# Patient Record
Sex: Female | Born: 1995 | Race: White | Hispanic: No | Marital: Single | Smoking: Former smoker
Health system: Southern US, Community
[De-identification: ages and names within clinical notes are randomized; demographics above are authoritative.]

## PROBLEM LIST (undated history)

## (undated) DIAGNOSIS — I10 Essential (primary) hypertension: Secondary | ICD-10-CM

## (undated) DIAGNOSIS — N809 Endometriosis, unspecified: Secondary | ICD-10-CM

## (undated) HISTORY — DX: Essential (primary) hypertension: I10

## (undated) HISTORY — PX: DILATION AND CURETTAGE OF UTERUS: SHX78

## (undated) HISTORY — PX: ADENOIDECTOMY: SUR15

---
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
## 2017-08-12 ENCOUNTER — Other Ambulatory Visit: Payer: Self-pay

## 2017-08-12 ENCOUNTER — Emergency Department
Admission: EM | Admit: 2017-08-12 | Discharge: 2017-08-12 | Disposition: A | Discharge status: Home / Self Care | Payer: Self-pay | Attending: Emergency Medicine | Admitting: Emergency Medicine

## 2017-08-12 DIAGNOSIS — R1030 Lower abdominal pain, unspecified: Secondary | ICD-10-CM

## 2017-08-12 DIAGNOSIS — F17200 Nicotine dependence, unspecified, uncomplicated: Secondary | ICD-10-CM | POA: Insufficient documentation

## 2017-08-12 DIAGNOSIS — N921 Excessive and frequent menstruation with irregular cycle: Secondary | ICD-10-CM | POA: Insufficient documentation

## 2017-08-12 HISTORY — DX: Endometriosis, unspecified: N80.9

## 2017-08-12 LAB — URINALYSIS, COMPLETE (UACMP) WITH MICROSCOPIC
BACTERIA UA: NONE SEEN
BILIRUBIN URINE: NEGATIVE
Glucose, UA: NEGATIVE mg/dL
Hgb urine dipstick: NEGATIVE
Ketones, ur: NEGATIVE mg/dL
LEUKOCYTES UA: NEGATIVE
Nitrite: NEGATIVE
Protein, ur: NEGATIVE mg/dL
Specific Gravity, Urine: 1.027 (ref 1.005–1.030)
pH: 6 (ref 5.0–8.0)

## 2017-08-12 LAB — POCT PREGNANCY, URINE: PREG TEST UR: NEGATIVE

## 2017-08-12 MED ORDER — NORGESTIMATE-ETH ESTRADIOL 0.25-35 MG-MCG PO TABS: 1.0000 | ORAL_TABLET | Freq: Every day | ORAL | 2 refills | Status: DC

## 2017-08-12 MED ORDER — KETOROLAC TROMETHAMINE 60 MG/2ML IM SOLN
15.0000 mg | Freq: Once | INTRAMUSCULAR | Status: AC
  Administered 2017-08-12: 15 mg via INTRAMUSCULAR
  Filled 2017-08-12: qty 2

## 2017-08-12 MED ORDER — ACETAMINOPHEN 500 MG PO TABS
1000.0000 mg | ORAL_TABLET | Freq: Once | ORAL | Status: AC
  Administered 2017-08-12: 1000 mg via ORAL
  Filled 2017-08-12: qty 2

## 2017-08-12 NOTE — ED Triage Notes (Signed)
Pt c/o lower abd pain for the past 2 months. States she has been dx with endometriosis  And has been having heavy periods. Denies N/V/D/fever..Marland Kitchen

## 2017-08-12 NOTE — ED Notes (Signed)
Dr. Stafford at bedside with pt.  

## 2017-08-12 NOTE — ED Notes (Signed)
Pt ambulatory to POC. VSS. NAD. Pain is better. Discharge instructions, RX and follow up discussed with pt. All questions answered.

## 2017-08-12 NOTE — Discharge Instructions (Signed)
Your urine test and pregnancy tests today were negative. Start taking birth control pills to help manage your symptoms. Follow up with gynecology for further management of these symptoms.

## 2017-08-12 NOTE — ED Provider Notes (Addendum)
[facility] Emergency Department Provider Note  ____________________________________________  Time seen: Approximately 10:43 AM  I have reviewed the triage vital signs and the nursing notes.   HISTORY  Chief Complaint Abdominal Pain    HPI Sylvia Parks is a 22 y.o. female who complains of lower abdominal pain for the past 2 months off and on, along with irregular bleeding approximately 15 or 16 days out of every month. Reports chronic problems with irregular bleeding for the past 4 years. Had been told in the past that she has endometriosis although she has not had a diagnostic laparoscopy. This had been improved by taking hormonal contraceptives in the past, but she has recently moved to [place] and has not followed up with any doctors recently. She is off hormonal contraceptives at this time. She is sexually active. She does report frequent UTIs as well. Complains of suprapubic pain that is nonradiating, no aggravating or alleviating factors, moderate intensity and aching. Feels that her abdominal pain and vaginal bleeding interferes with her ability to work and lifestyle.     Past Medical History:  Diagnosis Date  . Endometriosis      There are no active problems to display for this patient.    Past Surgical History:  Procedure Laterality Date  . ADENOIDECTOMY       Prior to Admission medications   Medication Sig Start Date End Date Taking? Authorizing Provider  amoxicillin (AMOXIL) 500 MG tablet Take 1 tablet (500 mg total) by mouth 3 (three) times daily with meals. Patient not taking: Reported on 08/12/2017 03/30/16   Hagler, Jami L, PA-C  lidocaine (XYLOCAINE) 2 % solution Use as directed 10 mLs in the mouth or throat every 4 (four) hours as needed for mouth pain. Patient not taking: Reported on 08/12/2017 03/30/16   Hagler, Jami L, PA-C  norgestimate-ethinyl estradiol (SPRINTEC 28) 0.25-35 MG-MCG tablet Take 1 tablet by mouth daily. 08/12/17    Sharman CheekStafford, Cianna Kasparian, MD  predniSONE (DELTASONE) 5 MG tablet Take 6 tablets (30 mg total) by mouth daily with breakfast. May take for up to 5 days.  Do not take any NSAIDs with this medication. Take with food. Patient not taking: Reported on 08/12/2017 03/30/16   Hagler, Jami L, PA-C     Allergies Patient has no known allergies.   No family history on file.  Social History Social History   Tobacco Use  . Smoking status: Current Some Day Smoker  . Smokeless tobacco: Never Used  Substance Use Topics  . Alcohol use: No  . Drug use: Not on file    Review of Systems  Constitutional:   No fever or chills.  ENT:   No sore throat. No rhinorrhea. Cardiovascular:   No chest pain or syncope. Respiratory:   No dyspnea or cough. Gastrointestinal:   Lower abdominal pain as above without vomiting and diarrhea.  Musculoskeletal:   Negative for focal pain or swelling All other systems reviewed and are negative except as documented above in ROS and HPI.  ____________________________________________   PHYSICAL EXAM:  VITAL SIGNS: ED Triage Vitals  Enc Vitals Group     BP 08/12/17 0818 (!) 166/97     Pulse Rate 08/12/17 0818 64     Resp 08/12/17 0818 16     Temp 08/12/17 0818 97.6 F (36.4 C)     Temp Source 08/12/17 0818 Oral     SpO2 08/12/17 0818 100 %     Weight 08/12/17 0819 140 lb (63.5 kg)  Height 08/12/17 0819 5\' 4"  (1.626 m)     Head Circumference --      Peak Flow --      Pain Score 08/12/17 0819 7     Pain Loc --      Pain Edu? --      Excl. in GC? --     Vital signs reviewed, nursing assessments reviewed.   Constitutional:   Alert and oriented. Well appearing and in no distress. Eyes:   No scleral icterus.  EOMI. No nystagmus. No conjunctival pallor. PERRL. ENT   Head:   Normocephalic and atraumatic.   Nose:   No congestion/rhinnorhea.    Mouth/Throat:   MMM, no pharyngeal erythema. No peritonsillar mass.    Neck:   No meningismus. Full  ROM. Hematological/Lymphatic/Immunilogical:   No cervical lymphadenopathy. Cardiovascular:   RRR. Symmetric bilateral radial and DP pulses.  No murmurs.  Respiratory:   Normal respiratory effort without tachypnea/retractions. Breath sounds are clear and equal bilaterally. No wheezes/rales/rhonchi. Gastrointestinal:   Soft with suprapubic tenderness. Non distended. There is no CVA tenderness.  No rebound, rigidity, or guarding. Genitourinary:   deferred Musculoskeletal:   Normal range of motion in all extremities. No joint effusions.  No lower extremity tenderness.  No edema. Neurologic:   Normal speech and language.  Motor grossly intact. No acute focal neurologic deficits are appreciated.  Skin:    Skin is warm, dry and intact. No rash noted.  No petechiae, purpura, or bullae.  ____________________________________________    LABS (pertinent positives/negatives) (all labs ordered are listed, but only abnormal results are displayed) Labs Reviewed  URINALYSIS, COMPLETE (UACMP) WITH MICROSCOPIC - Abnormal; Notable for the following components:      Result Value   Color, Urine YELLOW (*)    APPearance CLOUDY (*)    Squamous Epithelial / LPF 6-30 (*)    All other components within normal limits  POC URINE PREG, ED  POCT PREGNANCY, URINE   ____________________________________________   EKG    ____________________________________________    RADIOLOGY  No results found.  ____________________________________________   PROCEDURES Procedures  ____________________________________________    CLINICAL IMPRESSION / ASSESSMENT AND PLAN / ED COURSE  Pertinent labs & imaging results that were available during my care of the patient were reviewed by me and considered in my medical decision making (see chart for details).   Patient well-appearing no acute distress, unremarkable vital signs, appears to not be in severe pain, presents with chronic recurrent abdominal pain and  vaginal bleeding, metrorrhagia. Low suspicion of STI PID TOA or torsion. Low suspicion of ovarian cyst or appendicitis. This is all consistent with her previous chronic issues, likely worse now because she is unfortunately not been able to follow up with anybody or continue medications. Urinalysis is negative, pregnancy test is negative. I will prescribe her Sprintec OCPs, refer to gynecology for further management.      ____________________________________________   FINAL CLINICAL IMPRESSION(S) / ED DIAGNOSES    Final diagnoses:  Metrorrhagia  Lower abdominal pain     ED Discharge Orders        Ordered    norgestimate-ethinyl estradiol (SPRINTEC 28) 0.25-35 MG-MCG tablet  Daily     08/12/17 1042      Portions of this note were generated with dragon dictation software. Dictation errors may occur despite best attempts at proofreading.    Sharman Cheek, MD 08/12/17 1045    Sharman Cheek, MD 08/12/17 1046

## 2017-09-17 ENCOUNTER — Emergency Department
Admission: EM | Admit: 2017-09-17 | Discharge: 2017-09-17 | Disposition: A | Discharge status: Home / Self Care | Payer: Self-pay | Attending: Emergency Medicine | Admitting: Emergency Medicine

## 2017-09-17 ENCOUNTER — Emergency Department: Payer: Self-pay

## 2017-09-17 ENCOUNTER — Encounter: Payer: Self-pay | Admitting: Emergency Medicine

## 2017-09-17 DIAGNOSIS — Z79899 Other long term (current) drug therapy: Secondary | ICD-10-CM | POA: Insufficient documentation

## 2017-09-17 DIAGNOSIS — R102 Pelvic and perineal pain: Secondary | ICD-10-CM | POA: Insufficient documentation

## 2017-09-17 DIAGNOSIS — R1030 Lower abdominal pain, unspecified: Secondary | ICD-10-CM

## 2017-09-17 DIAGNOSIS — Z87891 Personal history of nicotine dependence: Secondary | ICD-10-CM | POA: Insufficient documentation

## 2017-09-17 LAB — CBC WITH DIFFERENTIAL/PLATELET
Basophils Absolute: 0.1 10*3/uL (ref 0–0.1)
Basophils Relative: 1 %
EOS PCT: 3 %
Eosinophils Absolute: 0.3 10*3/uL (ref 0–0.7)
HCT: 44.3 % (ref 35.0–47.0)
Hemoglobin: 14.6 g/dL (ref 12.0–16.0)
LYMPHS ABS: 2.2 10*3/uL (ref 1.0–3.6)
LYMPHS PCT: 25 %
MCH: 27.8 pg (ref 26.0–34.0)
MCHC: 33.1 g/dL (ref 32.0–36.0)
MCV: 84.1 fL (ref 80.0–100.0)
MONO ABS: 0.6 10*3/uL (ref 0.2–0.9)
MONOS PCT: 7 %
Neutro Abs: 5.5 10*3/uL (ref 1.4–6.5)
Neutrophils Relative %: 64 %
PLATELETS: 310 10*3/uL (ref 150–440)
RBC: 5.26 MIL/uL — ABNORMAL HIGH (ref 3.80–5.20)
RDW: 14.3 % (ref 11.5–14.5)
WBC: 8.7 10*3/uL (ref 3.6–11.0)

## 2017-09-17 LAB — CHLAMYDIA/NGC RT PCR (ARMC ONLY)
CHLAMYDIA TR: NOT DETECTED
N gonorrhoeae: NOT DETECTED

## 2017-09-17 LAB — WET PREP, GENITAL
Sperm: NONE SEEN
TRICH WET PREP: NONE SEEN
Yeast Wet Prep HPF POC: NONE SEEN

## 2017-09-17 LAB — URINALYSIS, ROUTINE W REFLEX MICROSCOPIC
BACTERIA UA: NONE SEEN
Bilirubin Urine: NEGATIVE
GLUCOSE, UA: NEGATIVE mg/dL
Hgb urine dipstick: NEGATIVE
Ketones, ur: NEGATIVE mg/dL
Leukocytes, UA: NEGATIVE
Nitrite: NEGATIVE
PH: 8 (ref 5.0–8.0)
Protein, ur: NEGATIVE mg/dL
Specific Gravity, Urine: 1.016 (ref 1.005–1.030)

## 2017-09-17 LAB — COMPREHENSIVE METABOLIC PANEL
ALBUMIN: 4.7 g/dL (ref 3.5–5.0)
ALT: 24 U/L (ref 14–54)
AST: 28 U/L (ref 15–41)
Alkaline Phosphatase: 76 U/L (ref 38–126)
Anion gap: 6 (ref 5–15)
BUN: 11 mg/dL (ref 6–20)
CO2: 26 mmol/L (ref 22–32)
Calcium: 9.4 mg/dL (ref 8.9–10.3)
Chloride: 106 mmol/L (ref 101–111)
Creatinine, Ser: 0.66 mg/dL (ref 0.44–1.00)
GFR calc Af Amer: >60 mL/min (ref >60)
GFR calc non Af Amer: >60 mL/min (ref >60)
GLUCOSE: 111 mg/dL — AB (ref 65–99)
POTASSIUM: 4.1 mmol/L (ref 3.5–5.1)
SODIUM: 138 mmol/L (ref 135–145)
TOTAL PROTEIN: 8 g/dL (ref 6.5–8.1)
Total Bilirubin: 0.4 mg/dL (ref 0.3–1.2)

## 2017-09-17 LAB — PREGNANCY, URINE: Preg Test, Ur: NEGATIVE

## 2017-09-17 MED ORDER — TRAMADOL HCL 50 MG PO TABS: 50.0000 mg | ORAL_TABLET | Freq: Four times a day (QID) | ORAL | 0 refills | Status: DC | PRN

## 2017-09-17 NOTE — ED Notes (Signed)
Urine POC negative. 

## 2017-09-17 NOTE — ED Notes (Signed)
Pt discharged to home.  Family member driving.  Discharge instructions reviewed.  Verbalized understanding.  No questions or concerns at this time.  Teach back verified.  Pt in NAD.  No items left in ED.   

## 2017-09-17 NOTE — ED Triage Notes (Signed)
Patient presents to the ED with heavy vaginal bleeding and abdominal pain since 2am.  Patient states she is having to change her tampon every hour.  Patient reports being seen in the ED approx. 2 months ago and diagnosed with endometriosis.  Patient was started on birth control at that time.  Patient reports having a "period" from March 24-April 4th and then began spotting and then bleeding yesterday/today.

## 2017-09-17 NOTE — ED Notes (Signed)
Patient transported to Ultrasound 

## 2017-09-17 NOTE — ED Provider Notes (Signed)
[facility] Emergency Department Provider Note  Time seen: 9:11 AM  I have reviewed the triage vital signs and the nursing notes.   HISTORY  Chief Complaint Pelvic Pain    HPI Sylvia Parks is a 22 y.o. female with a past medical history of endometriosis confirmed on laparoscopy 4 years ago per patient presents to the emergency department for vaginal bleeding and abdominal discomfort.  According to the patient she had her period March 24 - April 4.  States the bleeding is stopped until last night when she began having heavy bleeding again requiring her to change her pad every hour or so.  States that stopped around 4 5:00 this morning now she is having very light vaginal spotting.  Patient states she is having 4/10 lower abdominal pain which she states is very consistent with her typical endometriosis pain.  States she does not typically have bleeding between periods which is abnormal which is what concerned her and brought her to the emergency department today.  Patient denies any vaginal discharge.  Largely negative review of systems otherwise.   Past Medical History:  Diagnosis Date  . Endometriosis     There are no active problems to display for this patient.   Past Surgical History:  Procedure Laterality Date  . ADENOIDECTOMY      Prior to Admission medications   Medication Sig Start Date End Date Taking? Authorizing Provider  amoxicillin (AMOXIL) 500 MG tablet Take 1 tablet (500 mg total) by mouth 3 (three) times daily with meals. Patient not taking: Reported on 08/12/2017 03/30/16   Hagler, Jami L, PA-C  lidocaine (XYLOCAINE) 2 % solution Use as directed 10 mLs in the mouth or throat every 4 (four) hours as needed for mouth pain. Patient not taking: Reported on 08/12/2017 03/30/16   Hagler, Jami L, PA-C  norgestimate-ethinyl estradiol (SPRINTEC 28) 0.25-35 MG-MCG tablet Take 1 tablet by mouth daily. 08/12/17   Sharman Cheek, MD  predniSONE (DELTASONE) 5  MG tablet Take 6 tablets (30 mg total) by mouth daily with breakfast. May take for up to 5 days.  Do not take any NSAIDs with this medication. Take with food. Patient not taking: Reported on 08/12/2017 03/30/16   Hagler, Jami L, PA-C    No Known Allergies  No family history on file.  Social History Social History   Tobacco Use  . Smoking status: Former Games developer  . Smokeless tobacco: Never Used  Substance Use Topics  . Alcohol use: No  . Drug use: Not on file    Review of Systems Constitutional: Negative for fever. Eyes: Negative for visual complaints ENT: Negative for recent illness Cardiovascular: Negative for chest pain. Respiratory: Negative for shortness of breath. Gastrointestinal: 4/10 dull aching lower abdominal pain.  Negative for nausea vomiting or diarrhea Genitourinary: Negative for urinary compaints.  Heavy vaginal bleeding overnight, currently with light vaginal spotting per patient. Musculoskeletal: Negative for musculoskeletal complaints Skin: Negative for skin complaints  Neurological: Negative for headache All other ROS negative  ____________________________________________   PHYSICAL EXAM:  VITAL SIGNS: ED Triage Vitals  Enc Vitals Group     BP 09/17/17 0833 (!) 156/95     Pulse Rate 09/17/17 0833 81     Resp 09/17/17 0833 18     Temp 09/17/17 0833 97.9 F (36.6 C)     Temp Source 09/17/17 0833 Oral     SpO2 09/17/17 0833 98 %     Weight 09/17/17 0834 140 lb (63.5 kg)  Height 09/17/17 0834 5\' 4"  (1.626 m)     Head Circumference --      Peak Flow --      Pain Score 09/17/17 0833 7     Pain Loc --      Pain Edu? --      Excl. in GC? --     Constitutional: Alert and oriented. Well appearing and in no distress. Eyes: Normal exam ENT   Head: Normocephalic and atraumatic.   Mouth/Throat: Mucous membranes are moist. Cardiovascular: Normal rate, regular rhythm. No murmur Respiratory: Normal respiratory effort without tachypnea nor  retractions. Breath sounds are clear  Gastrointestinal: Soft, mild to moderate suprapubic tenderness to palpation.  No rebound or guarding.  No distention. Musculoskeletal: Nontender with normal range of motion in all extremities.  Neurologic:  Normal speech and language. No gross focal neurologic deficits  Skin:  Skin is warm, dry and intact.  Psychiatric: Mood and affect are normal.   ____________________________________________   RADIOLOGY  Ultrasound negative  ____________________________________________   INITIAL IMPRESSION / ASSESSMENT AND PLAN / ED COURSE  Pertinent labs & imaging results that were available during my care of the patient were reviewed by me and considered in my medical decision making (see chart for details).  Patient presents to the emergency department for lower abdominal discomfort and vaginal bleeding.  Differential would include endometriosis, dysfunctional uterine bleeding, miscarriage.  We will check labs including urine pregnancy test.  Will perform a pelvic examination and likely proceed with ultrasound.  Patient agreeable to this plan of care.  Patient has mild to moderate comfort throughout pelvic exam.  No bleeding.  Normal amount of discharge.  We will sent for ultrasound to further evaluate.  Patient agreeable to plan.  Ultrasound negative.  Wet prep and GC/chlamydia negative.  We will discharge the patient home with PCP follow-up.  Highly suspect endometriosis we will discharge with a short course of pain medication.  ____________________________________________   FINAL CLINICAL IMPRESSION(S) / ED DIAGNOSES  Lower abdominal pain Vaginal bleeding    Minna AntisPaduchowski, Karman Veney, MD 09/17/17 1234

## 2017-11-24 ENCOUNTER — Emergency Department
Admission: EM | Admit: 2017-11-24 | Discharge: 2017-11-24 | Disposition: A | Discharge status: Home / Self Care | Payer: Medicaid Other | Attending: Emergency Medicine | Admitting: Emergency Medicine

## 2017-11-24 ENCOUNTER — Other Ambulatory Visit: Payer: Self-pay

## 2017-11-24 ENCOUNTER — Encounter: Payer: Self-pay | Admitting: Emergency Medicine

## 2017-11-24 DIAGNOSIS — Z87891 Personal history of nicotine dependence: Secondary | ICD-10-CM | POA: Insufficient documentation

## 2017-11-24 DIAGNOSIS — Z79899 Other long term (current) drug therapy: Secondary | ICD-10-CM | POA: Insufficient documentation

## 2017-11-24 DIAGNOSIS — Y9389 Activity, other specified: Secondary | ICD-10-CM | POA: Insufficient documentation

## 2017-11-24 DIAGNOSIS — Y990 Civilian activity done for income or pay: Secondary | ICD-10-CM | POA: Insufficient documentation

## 2017-11-24 DIAGNOSIS — X100XXA Contact with hot drinks, initial encounter: Secondary | ICD-10-CM | POA: Insufficient documentation

## 2017-11-24 DIAGNOSIS — T22111A Burn of first degree of right forearm, initial encounter: Secondary | ICD-10-CM | POA: Insufficient documentation

## 2017-11-24 DIAGNOSIS — T23102A Burn of first degree of left hand, unspecified site, initial encounter: Secondary | ICD-10-CM | POA: Insufficient documentation

## 2017-11-24 DIAGNOSIS — T300 Burn of unspecified body region, unspecified degree: Secondary | ICD-10-CM

## 2017-11-24 DIAGNOSIS — Y92512 Supermarket, store or market as the place of occurrence of the external cause: Secondary | ICD-10-CM | POA: Insufficient documentation

## 2017-11-24 MED ORDER — MORPHINE SULFATE (PF) 4 MG/ML IV SOLN
4.0000 mg | Freq: Once | INTRAVENOUS | Status: AC
Start: 2017-11-24 — End: 2017-11-24
  Administered 2017-11-24: 4 mg via INTRAMUSCULAR
  Filled 2017-11-24: qty 1

## 2017-11-24 MED ORDER — OXYCODONE HCL 5 MG PO TABS: 5.0000 mg | ORAL_TABLET | Freq: Four times a day (QID) | ORAL | 0 refills | Status: AC | PRN

## 2017-11-24 MED ORDER — SILVER SULFADIAZINE 1 % EX CREA
TOPICAL_CREAM | Freq: Once | CUTANEOUS | Status: AC
  Administered 2017-11-24: 21:00:00 via TOPICAL
  Filled 2017-11-24: qty 85
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
## 2017-11-24 NOTE — Discharge Instructions (Addendum)
Please seek medical attention for any high fevers, chest pain, shortness of breath, change in behavior, persistent vomiting, bloody stool or any other new or concerning symptoms.  
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
## 2020-12-04 ENCOUNTER — Emergency Department
Admission: EM | Admit: 2020-12-04 | Discharge: 2020-12-04 | Disposition: A | Discharge status: Home / Self Care | Payer: Medicaid Other | Attending: Emergency Medicine | Admitting: Emergency Medicine

## 2020-12-04 ENCOUNTER — Other Ambulatory Visit: Payer: Self-pay

## 2020-12-04 DIAGNOSIS — N3001 Acute cystitis with hematuria: Secondary | ICD-10-CM | POA: Diagnosis not present

## 2020-12-04 DIAGNOSIS — Z87891 Personal history of nicotine dependence: Secondary | ICD-10-CM | POA: Insufficient documentation

## 2020-12-04 DIAGNOSIS — B9689 Other specified bacterial agents as the cause of diseases classified elsewhere: Secondary | ICD-10-CM | POA: Insufficient documentation

## 2020-12-04 DIAGNOSIS — R300 Dysuria: Secondary | ICD-10-CM | POA: Diagnosis present

## 2020-12-04 LAB — URINALYSIS, COMPLETE (UACMP) WITH MICROSCOPIC
Bilirubin Urine: NEGATIVE
Glucose, UA: NEGATIVE mg/dL
Ketones, ur: 20 mg/dL — AB
Nitrite: NEGATIVE
Protein, ur: 100 mg/dL — AB
Specific Gravity, Urine: 1.029 (ref 1.005–1.030)
WBC, UA: >50 WBC/hpf — ABNORMAL HIGH (ref 0–5)
pH: 5 (ref 5.0–8.0)

## 2020-12-04 LAB — POC URINE PREG, ED: Preg Test, Ur: NEGATIVE

## 2020-12-04 MED ORDER — CEPHALEXIN 500 MG PO CAPS: 500.0000 mg | ORAL_CAPSULE | Freq: Two times a day (BID) | ORAL | 0 refills | Status: AC

## 2020-12-04 NOTE — ED Triage Notes (Signed)
Pt to ED for discomfort when urinating that started yesterday, states discomfort also when not urinating.  Pt reports recently found out husband was cheating and concerned about STD's as well

## 2020-12-04 NOTE — ED Provider Notes (Signed)
[facility] Emergency Department Provider Note  ____________________________________________  Time seen: Approximately 3:56 PM  I have reviewed the triage vital signs and the nursing notes.   HISTORY  Chief Complaint Dysuria    HPI Sylvia Parks is a 25 y.o. female who presents to the emergency department for treatment and evaluation of dysuria and concern for STD. She denies fever, nausea, or vomiting.   Past Medical History:  Diagnosis Date   Endometriosis     There are no problems to display for this patient.   Past Surgical History:  Procedure Laterality Date   ADENOIDECTOMY      Prior to Admission medications   Medication Sig Start Date End Date Taking? Authorizing Provider  cephALEXin (KEFLEX) 500 MG capsule Take 1 capsule (500 mg total) by mouth 2 (two) times daily for 7 days. 12/04/20 12/11/20 Yes Tasheema Perrone B, FNP  acetaminophen (TYLENOL) 500 MG tablet Take 500 mg by mouth every 6 (six) hours as needed.    [provider]  lidocaine (XYLOCAINE) 2 % solution Use as directed 10 mLs in the mouth or throat every 4 (four) hours as needed for mouth pain. Patient not taking: Reported on 08/12/2017 03/30/16   Hagler, Jami L, PA-C  norgestimate-ethinyl estradiol (SPRINTEC 28) 0.25-35 MG-MCG tablet Take 1 tablet by mouth daily. 08/12/17   Sharman Cheek, MD  predniSONE (DELTASONE) 5 MG tablet Take 6 tablets (30 mg total) by mouth daily with breakfast. May take for up to 5 days.  Do not take any NSAIDs with this medication. Take with food. Patient not taking: Reported on 08/12/2017 03/30/16   Hagler, Jami L, PA-C  traMADol (ULTRAM) 50 MG tablet Take 1 tablet (50 mg total) by mouth every 6 (six) hours as needed. 09/17/17   Minna Antis, MD    Allergies Patient has no known allergies.  No family history on file.  Social History Social History   Tobacco Use   Smoking status: Former    Pack years: 0.00   Smokeless tobacco: Never   Substance Use Topics   Alcohol use: No    Review of Systems Constitutional: Negative for fever. Respiratory: Negative for shortness of breath or cough. Gastrointestinal: Negative for abdominal pain; negative for nausea , negative for vomiting. Genitourinary: Positive for dysuria , negative for vaginal discharge. Musculoskeletal: Negative for back pain. Skin: Negative for acute skin changes/rash/lesion. ____________________________________________   PHYSICAL EXAM:  VITAL SIGNS: ED Triage Vitals  Enc Vitals Group     BP 12/04/20 1443 133/87     Pulse Rate 12/04/20 1443 (!) 104     Resp 12/04/20 1443 16     Temp 12/04/20 1443 97.7 F (36.5 C)     Temp Source 12/04/20 1443 Oral     SpO2 12/04/20 1443 100 %     Weight 12/04/20 1444 150 lb (68 kg)     Height 12/04/20 1444 5\' 4"  (1.626 m)     Head Circumference --      Peak Flow --      Pain Score 12/04/20 1444 6     Pain Loc --      Pain Edu? --      Excl. in GC? --     Constitutional: Alert and oriented. Well appearing and in no acute distress. Eyes: Conjunctivae are normal. Head: Atraumatic. Nose: No congestion/rhinnorhea. Mouth/Throat: Mucous membranes are moist. Respiratory: Normal respiratory effort.  No retractions. Gastrointestinal: Bowel sounds active x 4; Abdomen is soft without rebound or guarding. Genitourinary: Pelvic  exam: Cervix closed. No discharge noted Musculoskeletal: No extremity tenderness nor edema.  Neurologic:  Normal speech and language. No gross focal neurologic deficits are appreciated. Speech is normal. No gait instability. Skin:  Skin is warm, dry and intact. No rash noted on exposed skin. Psychiatric: Mood and affect are normal. Speech and behavior are normal.  ____________________________________________   LABS (all labs ordered are listed, but only abnormal results are displayed)  Labs Reviewed  URINALYSIS, COMPLETE (UACMP) WITH MICROSCOPIC - Abnormal; Notable for the following  components:      Result Value   Color, Urine YELLOW (*)    APPearance CLOUDY (*)    Hgb urine dipstick LARGE (*)    Ketones, ur 20 (*)    Protein, ur 100 (*)    Leukocytes,Ua LARGE (*)    WBC, UA >50 (*)    Bacteria, UA RARE (*)    All other components within normal limits  CHLAMYDIA/NGC RT PCR ([facility] ONLY)            POC URINE PREG, ED   ____________________________________________  RADIOLOGY  Not indicated. ____________________________________________  Procedures  ____________________________________________  25 year old female presents to the ER for dysuria and STD concerns. See HPI.  Specimens collected and sent to lab although pelvic exam was reassuring. No STD treatment prior to discharge.  Urinalysis concerning for acute cystitis. Will treat with antibiotics and have her follow up with primary care in 10-14 days.  INITIAL IMPRESSION / ASSESSMENT AND PLAN / ED COURSE  Pertinent labs & imaging results that were available during my care of the patient were reviewed by me and considered in my medical decision making (see chart for details).  ____________________________________________   FINAL CLINICAL IMPRESSION(S) / ED DIAGNOSES  Final diagnoses:  Acute cystitis with hematuria    Note:  This document was prepared using Dragon voice recognition software and may include unintentional dictation errors.   Chinita Pester, FNP 12/05/20 0010    Sharman Cheek, MD 12/05/20 [phone]

## 2020-12-30 ENCOUNTER — Other Ambulatory Visit: Payer: Self-pay

## 2020-12-30 ENCOUNTER — Ambulatory Visit
Admission: EM | Admit: 2020-12-30 | Discharge: 2020-12-30 | Disposition: A | Discharge status: Home / Self Care | Payer: Medicaid Other | Attending: Family Medicine | Admitting: Family Medicine

## 2020-12-30 DIAGNOSIS — B9689 Other specified bacterial agents as the cause of diseases classified elsewhere: Secondary | ICD-10-CM | POA: Diagnosis not present

## 2020-12-30 DIAGNOSIS — N760 Acute vaginitis: Secondary | ICD-10-CM | POA: Diagnosis not present

## 2020-12-30 LAB — URINALYSIS, COMPLETE (UACMP) WITH MICROSCOPIC
Bilirubin Urine: NEGATIVE
Glucose, UA: NEGATIVE mg/dL
Ketones, ur: NEGATIVE mg/dL
Leukocytes,Ua: NEGATIVE
Nitrite: NEGATIVE
Protein, ur: NEGATIVE mg/dL
Specific Gravity, Urine: 1.025 (ref 1.005–1.030)
pH: 5 (ref 5.0–8.0)

## 2020-12-30 LAB — CHLAMYDIA/NGC RT PCR (ARMC ONLY)
Chlamydia Tr: NOT DETECTED
N gonorrhoeae: NOT DETECTED

## 2020-12-30 LAB — WET PREP, GENITAL
Sperm: NONE SEEN
Trich, Wet Prep: NONE SEEN
Yeast Wet Prep HPF POC: NONE SEEN

## 2020-12-30 MED ORDER — METRONIDAZOLE 500 MG PO TABS: 500.0000 mg | ORAL_TABLET | Freq: Two times a day (BID) | ORAL | 0 refills | Status: DC

## 2020-12-30 NOTE — ED Provider Notes (Signed)
[facility]    CSN: [id number] Arrival date & time: 12/30/20  0941      History   Chief Complaint Chief Complaint  Patient presents with   Urinary Frequency    HPI Sylvia Parks is a 25 y.o. female.   HPI  25 year old female here for evaluation of urinary complaints.  Patient reports she has been experiencing urinary urgency, frequency, and burning that is associated with low back pain and cloudy urine for the last 3 weeks.  She was evaluated in the emergency department and treated for UTI with Keflex 500 mg twice daily for 7 days.  She reports that she took the medication but she is continued to have symptoms.  She works at [employer] on [employer] and states she only gets 3 bathroom breaks during her 12-hour shift so she has to hold her urine.  She denies any fever, blood in urine, nausea, vomiting.  She does have suprapubic pain as well.  Past Medical History:  Diagnosis Date   Endometriosis     There are no problems to display for this patient.   Past Surgical History:  Procedure Laterality Date   ADENOIDECTOMY      OB History   No obstetric history on file.      Home Medications    Prior to Admission medications   Medication Sig Start Date End Date Taking? Authorizing Provider  acetaminophen (TYLENOL) 500 MG tablet Take 500 mg by mouth every 6 (six) hours as needed.   Yes [provider]  metroNIDAZOLE (FLAGYL) 500 MG tablet Take 1 tablet (500 mg total) by mouth 2 (two) times daily. 12/30/20  Yes Becky Augusta, NP    Family History No family history on file.  Social History Social History   Tobacco Use   Smoking status: Former   Smokeless tobacco: Never  Substance Use Topics   Alcohol use: No     Allergies   Patient has no known allergies.   Review of Systems Review of Systems  Constitutional:  Negative for activity change, appetite change and fever.  Gastrointestinal:  Positive for abdominal pain. Negative for  diarrhea, nausea and vomiting.  Genitourinary:  Positive for dysuria, frequency and urgency. Negative for hematuria.  Musculoskeletal:  Positive for back pain.  Hematological: Negative.   Psychiatric/Behavioral: Negative.      Physical Exam Triage Vital Signs ED Triage Vitals  Enc Vitals Group     BP 12/30/20 0953 132/82     Pulse Rate 12/30/20 0953 70     Resp 12/30/20 0953 18     Temp 12/30/20 0953 98.2 F (36.8 C)     Temp Source 12/30/20 0953 Oral     SpO2 12/30/20 0953 100 %     Weight 12/30/20 0952 145 lb (65.8 kg)     Height --      Head Circumference --      Peak Flow --      Pain Score 12/30/20 0951 6     Pain Loc --      Pain Edu? --      Excl. in GC? --    No data found.  Updated Vital Signs BP 132/82 (BP Location: Left Arm)   Pulse 70   Temp 98.2 F (36.8 C) (Oral)   Resp 18   Wt 145 lb (65.8 kg)   LMP 11/23/2020   SpO2 100%   BMI 24.89 kg/m   Visual Acuity Right Eye Distance:   Left Eye Distance:  Bilateral Distance:    Right Eye Near:   Left Eye Near:    Bilateral Near:     Physical Exam Vitals and nursing note reviewed.  Constitutional:      General: She is not in acute distress.    Appearance: Normal appearance. She is normal weight. She is not ill-appearing.  HENT:     Head: Normocephalic and atraumatic.  Cardiovascular:     Rate and Rhythm: Normal rate and regular rhythm.     Pulses: Normal pulses.     Heart sounds: Normal heart sounds. No murmur heard.   No gallop.  Pulmonary:     Effort: Pulmonary effort is normal.     Breath sounds: Normal breath sounds. No wheezing, rhonchi or rales.  Abdominal:     General: Abdomen is flat.     Palpations: Abdomen is soft.     Tenderness: There is abdominal tenderness. There is no right CVA tenderness, left CVA tenderness, guarding or rebound.  Skin:    General: Skin is warm and dry.     Capillary Refill: Capillary refill takes less than 2 seconds.     Findings: No erythema or rash.   Neurological:     General: No focal deficit present.     Mental Status: She is alert and oriented to person, place, and time.  Psychiatric:        Mood and Affect: Mood normal.        Behavior: Behavior normal.        Thought Content: Thought content normal.        Judgment: Judgment normal.     UC Treatments / Results  Labs (all labs ordered are listed, but only abnormal results are displayed) Labs Reviewed  WET PREP, GENITAL - Abnormal; Notable for the following components:      Result Value   Clue Cells Wet Prep HPF POC PRESENT (*)    WBC, Wet Prep HPF POC FEW (*)    All other components within normal limits  URINALYSIS, COMPLETE (UACMP) WITH MICROSCOPIC - Abnormal; Notable for the following components:   Hgb urine dipstick MODERATE (*)    Bacteria, UA FEW (*)    All other components within normal limits  CHLAMYDIA/NGC RT PCR [facility] ONLY)              EKG   Radiology No results found.  Procedures Procedures (including critical care time)  Medications Ordered in UC Medications - No data to display  Initial Impression / Assessment and Plan / UC Course  I have reviewed the triage vital signs and the nursing notes.  Pertinent labs & imaging results that were available during my care of the patient were reviewed by me and considered in my medical decision making (see chart for details).  Patient is a nontoxic-appearing 25 year old female here for evaluation of urinary complaints as outlined HPI above.  Patient was treated for UTI with fiber and Keflex twice daily for 7 days at that time.  Patient reports that she was tested for STIs at the time as well but there are no results in epic.  Patient's physical exam reveals a benign cardiopulmonary exam with clear lung sounds in all fields.  Patient has no CVA tenderness on exam.  Abdomen is flat, soft, with positive bowel sounds in all 4 quadrants.  Patient does have mild suprapubic tenderness but the remainder the abdomen is  benign.  Urinalysis collected at triage.  Urinalysis shows moderate hemoglobin and few bacteria.  No  nitrites, leukocytes, squamous epithelials, or WBCs.  We will send urine for culture and check wet prep.  We will also check gonorrhea and chlamydia as she was not tested for that previously.  Wet prep shows the presence of clue cells and WBCs but is negative for yeast or trichomoniasis.  Gonorrhea and Chlamydia is still processing.  We will treat patient for bacterial vaginosis with metronidazole twice daily for 7 days.  We will hold on empiric treatment for STIs and will only treat if her testing comes back positive.  Work note provided.   Final Clinical Impressions(s) / UC Diagnoses   Final diagnoses:  BV (bacterial vaginosis)     Discharge Instructions      Take the Flagyl (metronidazole) 500 mg twice daily for treatment of your bacterial vaginosis.  Avoid alcohol while on the metronidazole as taken together will cause of vomiting.  Bacterial vaginosis is often caused by a imbalance of bacteria in your vaginal vault.  This is sometimes a result of using tampons or hormonal fluctuations during her menstrual cycle.  You if your symptoms are recurrent you can try using a boric acid suppository twice weekly to help maintain the acid-base balance in your vagina vault which could prevent further infection.  You can also try vaginal probiotics to help return normal bacterial balance.   If your STI testing comes back positive then we will treat you at that time but we will not treat you empirically at this time.  Return for reevaluation for new or worsening symptoms.     ED Prescriptions     Medication Sig Dispense Auth. Provider   metroNIDAZOLE (FLAGYL) 500 MG tablet Take 1 tablet (500 mg total) by mouth 2 (two) times daily. 14 tablet Becky Augusta, NP      PDMP not reviewed this encounter.   Becky Augusta, NP 12/30/20 [phone]

## 2020-12-30 NOTE — ED Triage Notes (Signed)
Pt c/o urinary urgency, frequency, and burning. Pt states she was treated for a UtI 3 weeks ago at the Er but it has worsened.

## 2020-12-30 NOTE — Discharge Instructions (Addendum)
Take the Flagyl (metronidazole) 500 mg twice daily for treatment of your bacterial vaginosis.  Avoid alcohol while on the metronidazole as taken together will cause of vomiting.  Bacterial vaginosis is often caused by a imbalance of bacteria in your vaginal vault.  This is sometimes a result of using tampons or hormonal fluctuations during her menstrual cycle.  You if your symptoms are recurrent you can try using a boric acid suppository twice weekly to help maintain the acid-base balance in your vagina vault which could prevent further infection.  You can also try vaginal probiotics to help return normal bacterial balance.   If your STI testing comes back positive then we will treat you at that time but we will not treat you empirically at this time.  Return for reevaluation for new or worsening symptoms.

## 2021-02-18 ENCOUNTER — Other Ambulatory Visit: Payer: Self-pay

## 2021-02-18 ENCOUNTER — Encounter: Payer: Self-pay | Admitting: Emergency Medicine

## 2021-02-18 ENCOUNTER — Emergency Department
Admission: EM | Admit: 2021-02-18 | Discharge: 2021-02-18 | Discharge status: Against Medical Advice | Payer: Medicaid Other | Attending: Emergency Medicine | Admitting: Emergency Medicine

## 2021-02-18 DIAGNOSIS — Z5321 Procedure and treatment not carried out due to patient leaving prior to being seen by health care provider: Secondary | ICD-10-CM | POA: Diagnosis not present

## 2021-02-18 DIAGNOSIS — K0889 Other specified disorders of teeth and supporting structures: Secondary | ICD-10-CM | POA: Insufficient documentation

## 2021-02-18 NOTE — ED Triage Notes (Signed)
Patient ambulatory to triage with steady gait, without difficulty or distress noted; pt reports persistent rt sided dental pain after having a tooth break off wk ago

## 2021-05-13 ENCOUNTER — Emergency Department: Payer: Medicaid Other

## 2021-05-13 ENCOUNTER — Encounter: Payer: Self-pay | Admitting: Emergency Medicine

## 2021-05-13 ENCOUNTER — Emergency Department
Admission: EM | Admit: 2021-05-13 | Discharge: 2021-05-13 | Disposition: A | Discharge status: Home / Self Care | Payer: Medicaid Other | Attending: Emergency Medicine | Admitting: Emergency Medicine

## 2021-05-13 ENCOUNTER — Other Ambulatory Visit: Payer: Self-pay

## 2021-05-13 DIAGNOSIS — R1030 Lower abdominal pain, unspecified: Secondary | ICD-10-CM | POA: Diagnosis not present

## 2021-05-13 DIAGNOSIS — R109 Unspecified abdominal pain: Secondary | ICD-10-CM

## 2021-05-13 DIAGNOSIS — O26892 Other specified pregnancy related conditions, second trimester: Secondary | ICD-10-CM | POA: Diagnosis present

## 2021-05-13 DIAGNOSIS — Z87891 Personal history of nicotine dependence: Secondary | ICD-10-CM | POA: Diagnosis not present

## 2021-05-13 DIAGNOSIS — Z3A18 18 weeks gestation of pregnancy: Secondary | ICD-10-CM | POA: Diagnosis not present

## 2021-05-13 DIAGNOSIS — Z3491 Encounter for supervision of normal pregnancy, unspecified, first trimester: Secondary | ICD-10-CM

## 2021-05-13 LAB — URINALYSIS, ROUTINE W REFLEX MICROSCOPIC
Bilirubin Urine: NEGATIVE
Glucose, UA: NEGATIVE mg/dL
Hgb urine dipstick: NEGATIVE
Ketones, ur: NEGATIVE mg/dL
Leukocytes,Ua: NEGATIVE
Nitrite: NEGATIVE
Protein, ur: NEGATIVE mg/dL
Specific Gravity, Urine: 1.023 (ref 1.005–1.030)
pH: 6 (ref 5.0–8.0)

## 2021-05-13 LAB — POC URINE PREG, ED: Preg Test, Ur: POSITIVE — AB

## 2021-05-13 LAB — ABO/RH: ABO/RH(D): B POS

## 2021-05-13 LAB — HCG, QUANTITATIVE, PREGNANCY: hCG, Beta Chain, Quant, S: 320 m[IU]/mL — ABNORMAL HIGH (ref <5)

## 2021-05-13 NOTE — ED Triage Notes (Signed)
Pt would like to pull up her MyChart results and does not want to get stuck again today. She will give a urine sample though

## 2021-05-13 NOTE — ED Triage Notes (Addendum)
Pt reports that she just found out that she was pregnant and is having upper right quad pain, she was unable to get an appt until Jan and they told her to come here to be evaluated. She is unsure of how far along she is. Pt was at [facility] yesterday and had blood work, urine and an [place], they gave her blood and urine results on her MyChart but she could not see her [place] results, she left after waiting 10 hours, states that she is still hurting a little today.

## 2021-05-13 NOTE — ED Provider Notes (Signed)
[facility] Emergency Department Provider Note   ____________________________________________   Event Date/Time   First MD Initiated Contact with Patient 05/13/21 1445     (approximate)  I have reviewed the triage vital signs and the nursing notes.   HISTORY  Chief Complaint Abdominal Pain    HPI Sylvia Parks is a 25 y.o. female history of 1 previous pregnancy with early delivery and infant death occurring approximate 18 weeks of pregnancy    Patient had a history of prior D&C  Take symptoms or fertility treatments or medications.  She believes her last menstrual cycle was approximately October 13.  She believes that she should be about [redacted] weeks pregnant.  For about a week and a half now she is had very mild but slight crampy discomfort over her right lower abdomen off and on.  No vaginal bleeding or discharge  Went to [facility] yesterday to get checked out and have an ultrasound done and she had called to set up a OB appointment but cannot be seen by [facility] clinic until January for which she does have an appointment  Denies being any pain or discomfort now.  Occasional nausea.  No fevers or chills  No vaginal bleeding or discharge   Past Medical History:  Diagnosis Date   Endometriosis     There are no problems to display for this patient.   Past Surgical History:  Procedure Laterality Date   ADENOIDECTOMY      Prior to Admission medications   Medication Sig Start Date End Date Taking? Authorizing Provider  acetaminophen (TYLENOL) 500 MG tablet Take 500 mg by mouth every 6 (six) hours as needed.    [provider]  metroNIDAZOLE (FLAGYL) 500 MG tablet Take 1 tablet (500 mg total) by mouth 2 (two) times daily. 12/30/20   Becky Augusta, NP    Allergies Patient has no known allergies.  No family history on file.  Social History Social History   Tobacco Use   Smoking status: Former   Smokeless tobacco: Never  Water quality scientist Use: Never used  Substance Use Topics   Alcohol use: No    Review of Systems Constitutional: No fever/chills Eyes: No visual changes. ENT: No sore throat. Cardiovascular: Denies chest pain. Respiratory: Denies shortness of breath. Gastrointestinal: No abdominal pain.  See HPI occasional slight crampy discomfort in her lower pelvis on the right Genitourinary: Negative for dysuria. Musculoskeletal: Negative for back pain. Skin: Negative for rash. Neurological: Negative for headaches, areas of focal weakness or numbness.    ____________________________________________   PHYSICAL EXAM:  VITAL SIGNS: ED Triage Vitals  Enc Vitals Group     BP 05/13/21 1330 135/70     Pulse Rate 05/13/21 1330 72     Resp 05/13/21 1330 18     Temp 05/13/21 1330 98.3 F (36.8 C)     Temp Source 05/13/21 1330 Oral     SpO2 05/13/21 1330 96 %     Weight 05/13/21 1332 145 lb (65.8 kg)     Height 05/13/21 1332 5\' 4"  (1.626 m)     Head Circumference --      Peak Flow --      Pain Score 05/13/21 1331 4     Pain Loc --      Pain Edu? --      Excl. in GC? --     Constitutional: Alert and oriented. Well appearing and in no acute distress. Eyes: Conjunctivae are normal.  Head: Atraumatic. Nose: No congestion/rhinnorhea. Mouth/Throat: Mucous membranes are moist. Neck: No stridor.  Cardiovascular: Normal rate, regular rhythm. Grossly normal heart sounds.  Good peripheral circulation. Respiratory: Normal respiratory effort.  No retractions. Lungs CTAB. Gastrointestinal: Soft and nontender. No distention.  No elicitable pain to palpation of McBurney's point or anywhere in the abdomen or pelvis on external exam.  No rebound or guarding.  No distention.  Not obviously gravid Musculoskeletal: No lower extremity tenderness nor edema. Neurologic:  Normal speech and language. No gross focal neurologic deficits are appreciated.  Skin:  Skin is warm, dry and intact. No rash noted. Psychiatric:  Mood and affect are normal. Speech and behavior are normal.  ____________________________________________   LABS (all labs ordered are listed, but only abnormal results are displayed)  Labs Reviewed  URINALYSIS, ROUTINE W REFLEX MICROSCOPIC - Abnormal; Notable for the following components:      Result Value   Color, Urine YELLOW (*)    APPearance HAZY (*)    All other components within normal limits  HCG, QUANTITATIVE, PREGNANCY - Abnormal; Notable for the following components:   hCG, Beta Chain, Quant, S 320 (*)    All other components within normal limits  POC URINE PREG, ED - Abnormal; Notable for the following components:   Preg Test, Ur Positive (*)    All other components within normal limits  URINE CULTURE  ABO/RH   ____________________________________________  EKG   ____________________________________________  RADIOLOGY  US OB LESS THAN 14 WEEKS WITH OB TRANSVAGINAL  Result Date: 05/13/2021 CLINICAL DATA:  Right-sided cramping EXAM: OBSTETRIC <14 WK [place] AND TRANSVAGINAL OB US TECHNIQUE: Both transabdominal and transvaginal ultrasound examinations were performed for complete evaluation of the gestation as well as the maternal uterus, adnexal regions, and pelvic cul-de-sac. Transvaginal technique was performed to assess early pregnancy. COMPARISON:  None. FINDINGS: Intrauterine gestational sac: Single Yolk sac:  Not Visualized. Embryo:  Not Visualized. Cardiac Activity: Not Visualized. Heart Rate:   Not applicable. MSD: 3.7 mm   5 w   1 d [place] EDC: Not applicable Subchorionic hemorrhage:  None visualized. Maternal uterus/adnexae: 1.7 cm complex likely involuting cyst/corpus luteum of the right ovary. 1.9 cm simple appearing follicle of the right ovary also noted. Both ovaries demonstrate color doppler flow. No suspicious adnexal mass or free fluid identified. IMPRESSION: Small intrauterine cystic structure which may represent a gestational sac with no fetal pole or heart tones  identified. Correlate with serial beta HCG and follow-up with ultrasound in 10-14 days. Electronically Signed   By: Jannifer Hick M.D.   On: 05/13/2021 17:37     Ultrasound reviewed, cystic structure noted intrauterine.  Of note the patient hCG falls below the discriminatory zone  Patient's hCG is rising from level at [facility] yesterday ____________________________________________   PROCEDURES  Procedure(s) performed: None  Procedures  Critical Care performed: No  ____________________________________________   INITIAL IMPRESSION / ASSESSMENT AND PLAN / ED COURSE  Pertinent labs & imaging results that were available during my care of the patient were reviewed by me and considered in my medical decision making (see chart for details).   Patient presents today, was seen at [facility] but not seen by the physician as she reports the wait was very long.  Reviewed work-up from yesterday at [facility] her hCG was just less than 200 she had an ultrasound that did not demonstrate a definitive intrauterine pregnancy.  She reports that she would be approximately [redacted] weeks pregnant or just short of that by last menstrual cycle,  but her hCG clinical history/review of work-up at [facility] seem to suggest that she might be quite a bit earlier potentially.  Based on her clinical exam I do not have a high suspicion for ectopic pregnancy, but we will recheck hCG, reimage ultrasound here for further evaluation is ongoing symptoms of right lower cramping.  Very reassuring abdominal exam doubt acute intra-abdominal etiology such as appendicitis torsion etc. no urinary symptoms or symptoms of urine infection  Rh+  Clinical Course as of 05/13/21 1815  Tue May 13, 2021  1813 Clinical history discussed with Dr. Jerene Pitch and reviewed today's work-up.  She recommends outpatient follow-up and will have clinic call patient for labs Thursday and a physician visit likely Friday.  This is appropriate.  Discussed plan careful return  precautions and follow-up recommendations with the patient and her boyfriend, both are agreeable and they anticipate follow-up Thursday and calling the [facility] clinic tomorrow morning [MQ]  1814 Patient awake alert asymptomatic without distress. [MQ]    Clinical Course User Index [MQ] Sharyn Creamer, MD     ____________________________________________   FINAL CLINICAL IMPRESSION(S) / ED DIAGNOSES  Final diagnoses:  First trimester pregnancy        Note:  This document was prepared using Dragon voice recognition software and may include unintentional dictation errors       Sharyn Creamer, MD 05/13/21 1815

## 2021-05-13 NOTE — Discharge Instructions (Addendum)
Please follow-up with [facility], call their office tomorrow morning to schedule an appointment for Thursday or Friday to see one of the physicians.

## 2021-05-14 ENCOUNTER — Other Ambulatory Visit: Payer: Self-pay | Admitting: Obstetrics and Gynecology

## 2021-05-14 DIAGNOSIS — O3680X0 Pregnancy with inconclusive fetal viability, not applicable or unspecified: Secondary | ICD-10-CM

## 2021-05-15 ENCOUNTER — Other Ambulatory Visit: Payer: Medicaid Other

## 2021-05-15 ENCOUNTER — Other Ambulatory Visit: Payer: Self-pay

## 2021-05-15 DIAGNOSIS — O3680X0 Pregnancy with inconclusive fetal viability, not applicable or unspecified: Secondary | ICD-10-CM

## 2021-05-15 LAB — URINE CULTURE

## 2021-05-16 ENCOUNTER — Ambulatory Visit (INDEPENDENT_AMBULATORY_CARE_PROVIDER_SITE_OTHER): Payer: Medicaid Other | Admitting: Obstetrics and Gynecology

## 2021-05-16 ENCOUNTER — Telehealth: Payer: Self-pay

## 2021-05-16 ENCOUNTER — Encounter: Payer: Self-pay | Admitting: Obstetrics and Gynecology

## 2021-05-16 VITALS — BP 120/70 | Ht 64.0 in | Wt 147.4 lb

## 2021-05-16 DIAGNOSIS — O3680X0 Pregnancy with inconclusive fetal viability, not applicable or unspecified: Secondary | ICD-10-CM | POA: Diagnosis not present

## 2021-05-16 LAB — BETA HCG QUANT (REF LAB): hCG Quant: 635 m[IU]/mL

## 2021-05-16 NOTE — Patient Instructions (Signed)
First Trimester of Pregnancy °The first trimester of pregnancy starts on the first day of your last menstrual period until the end of week 12. This is months 1 through 3 of pregnancy. A week after a sperm fertilizes an egg, the egg will implant into the wall of the uterus and begin to develop into a baby. By the end of 12 weeks, all the baby's organs will be formed and the baby will be 2-3 inches in size. °Body changes during your first trimester °Your body goes through many changes during pregnancy. The changes vary and generally return to normal after your baby is born. °Physical changes °You may gain or lose weight. °Your breasts may begin to grow larger and become tender. The tissue that surrounds your nipples (areola) may become darker. °Dark spots or blotches (chloasma or mask of pregnancy) may develop on your face. °You may have changes in your hair. These can include thickening or thinning of your hair or changes in texture. °Health changes °You may feel nauseous, and you may vomit. °You may have heartburn. °You may develop headaches. °You may develop constipation. °Your gums may bleed and may be sensitive to brushing and flossing. °Other changes °You may tire easily. °You may urinate more often. °Your menstrual periods will stop. °You may have a loss of appetite. °You may develop cravings for certain kinds of food. °You may have changes in your emotions from day to day. °You may have more vivid and strange dreams. °Follow these instructions at home: °Medicines °Follow your health care provider's instructions regarding medicine use. Specific medicines may be either safe or unsafe to take during pregnancy. Do not take any medicines unless told to by your health care provider. °Take a prenatal vitamin that contains at least 600 micrograms (mcg) of folic acid. °Eating and drinking °Eat a healthy diet that includes fresh fruits and vegetables, whole grains, good sources of protein such as meat, eggs, or tofu,  and low-fat dairy products. °Avoid raw meat and unpasteurized juice, milk, and cheese. These carry germs that can harm you and your baby. °If you feel nauseous or you vomit: °Eat 4 or 5 small meals a day instead of 3 large meals. °Try eating a few soda crackers. °Drink liquids between meals instead of during meals. °You may need to take these actions to prevent or treat constipation: °Drink enough fluid to keep your urine pale yellow. °Eat foods that are high in fiber, such as beans, whole grains, and fresh fruits and vegetables. °Limit foods that are high in fat and processed sugars, such as fried or sweet foods. °Activity °Exercise only as directed by your health care provider. Most people can continue their usual exercise routine during pregnancy. Try to exercise for 30 minutes at least 5 days a week. °Stop exercising if you develop pain or cramping in the lower abdomen or lower back. °Avoid exercising if it is very hot or humid or if you are at high altitude. °Avoid heavy lifting. °If you choose to, you may have sex unless your health care provider tells you not to. °Relieving pain and discomfort °Wear a good support bra to relieve breast tenderness. °Rest with your legs elevated if you have leg cramps or low back pain. °If you develop bulging veins (varicose veins) in your legs: °Wear support hose as told by your health care provider. °Elevate your feet for 15 minutes, 3-4 times a day. °Limit salt in your diet. °Safety °Wear your seat belt at all times when driving   or riding in a car. °Talk with your health care provider if someone is verbally or physically abusive to you. °Talk with your health care provider if you are feeling sad or have thoughts of hurting yourself. °Lifestyle °Do not use hot tubs, steam rooms, or saunas. °Do not douche. Do not use tampons or scented sanitary pads. °Do not use herbal remedies, alcohol, illegal drugs, or medicines that are not approved by your health care provider. Chemicals  in these products can harm your baby. °Do not use any products that contain nicotine or tobacco, such as cigarettes, e-cigarettes, and chewing tobacco. If you need help quitting, ask your health care provider. °Avoid cat litter boxes and soil used by cats. These carry germs that can cause birth defects in the baby and possibly loss of the unborn baby (fetus) by miscarriage or stillbirth. °General instructions °During routine prenatal visits in the first trimester, your health care provider will do a physical exam, perform necessary tests, and ask you how things are going. Keep all follow-up visits. This is important. °Ask for help if you have counseling or nutritional needs during pregnancy. Your health care provider can offer advice or refer you to specialists for help with various needs. °Schedule a dentist appointment. At home, brush your teeth with a soft toothbrush. Floss gently. °Write down your questions. Take them to your prenatal visits. °Where to find more information °American Pregnancy Association: americanpregnancy.org °American College of Obstetricians and Gynecologists: acog.org/en/Womens%20Health/Pregnancy °Office on Women's Health: womenshealth.gov/pregnancy °Contact a health care provider if you have: °Dizziness. °A fever. °Mild pelvic cramps, pelvic pressure, or nagging pain in the abdominal area. °Nausea, vomiting, or diarrhea that lasts for 24 hours or longer. °A bad-smelling vaginal discharge. °Pain when you urinate. °Known exposure to a contagious illness, such as chickenpox, measles, Zika virus, HIV, or hepatitis. °Get help right away if you have: °Spotting or bleeding from your vagina. °Severe abdominal cramping or pain. °Shortness of breath or chest pain. °Any kind of trauma, such as from a fall or a car crash. °New or increased pain, swelling, or redness in an arm or leg. °Summary °The first trimester of pregnancy starts on the first day of your last menstrual period until the end of week  12 (months 1 through 3). °Eating 4 or 5 small meals a day rather than 3 large meals may help to relieve nausea and vomiting. °Do not use any products that contain nicotine or tobacco, such as cigarettes, e-cigarettes, and chewing tobacco. If you need help quitting, ask your health care provider. °Keep all follow-up visits. This is important. °This information is not intended to replace advice given to you by your health care provider. Make sure you discuss any questions you have with your health care provider. °Document Revised: 11/01/2019 Document Reviewed: 09/07/2019 °Elsevier Patient Education © 2022 Elsevier Inc. ° °

## 2021-05-16 NOTE — Progress Notes (Signed)
Patient ID: Fay Records, female   DOB: [birth year], 25 y.o.   MRN: [id number]  Reason for Consult: Follow-up   Referred by No ref. provider found  Subjective:     HPI:  Anuhea Gassner is a 25 y.o. female she presents today for follow-up from the emergency room.  She reports that she presented to the emergency room because she had a positive pregnancy test at home.  She has a history of an 18-week loss related to a chronic abruption and reports that pregnancy now makes her anxious.  She denies any abdominal pain.  She reports that she has not had abnormal uterine bleeding.  Her last menstrual period was in October however her hospital ultrasound suggest that this was an early pregnancy.  She denies any vaginal bleeding in November.    Gynecological History   Past Medical History:  Diagnosis Date   Endometriosis    No family history on file. Past Surgical History:  Procedure Laterality Date   ADENOIDECTOMY      Short Social History:  Social History   Tobacco Use   Smoking status: Former   Smokeless tobacco: Never  Substance Use Topics   Alcohol use: No    No Known Allergies  Current Outpatient Medications  Medication Sig Dispense Refill   acetaminophen (TYLENOL) 500 MG tablet Take 500 mg by mouth every 6 (six) hours as needed. (Patient not taking: Reported on 05/16/2021)     metroNIDAZOLE (FLAGYL) 500 MG tablet Take 1 tablet (500 mg total) by mouth 2 (two) times daily. (Patient not taking: Reported on 05/16/2021) 14 tablet 0   No current facility-administered medications for this visit.    Review of Systems  Constitutional: Negative for chills, fatigue, fever and unexpected weight change.  HENT: Negative for trouble swallowing.  Eyes: Negative for loss of vision.  Respiratory: Negative for cough, shortness of breath and wheezing.  Cardiovascular: Negative for chest pain, leg swelling, palpitations and syncope.  GI: Negative for abdominal pain, blood in stool, diarrhea,  nausea and vomiting.  GU: Negative for difficulty urinating, dysuria, frequency and hematuria.  Musculoskeletal: Negative for back pain, leg pain and joint pain.  Skin: Negative for rash.  Neurological: Negative for dizziness, headaches, light-headedness, numbness and seizures.  Psychiatric: Negative for behavioral problem, confusion, depressed mood and sleep disturbance.       Objective:  Objective   Vitals:   05/16/21 1326  BP: 120/70  Weight: 147 lb 6.4 oz (66.9 kg)  Height: 5\' 4"  (1.626 m)   Body mass index is 25.3 kg/m.  Physical Exam Vitals and nursing note reviewed. Exam conducted with a chaperone present.  Constitutional:      Appearance: Normal appearance.  HENT:     Head: Normocephalic and atraumatic.  Eyes:     Extraocular Movements: Extraocular movements intact.     Pupils: Pupils are equal, round, and reactive to light.  Cardiovascular:     Rate and Rhythm: Normal rate and regular rhythm.  Pulmonary:     Effort: Pulmonary effort is normal.     Breath sounds: Normal breath sounds.  Abdominal:     General: Abdomen is flat.     Palpations: Abdomen is soft.  Musculoskeletal:     Cervical back: Normal range of motion.  Skin:    General: Skin is warm and dry.  Neurological:     General: No focal deficit present.     Mental Status: She is alert and oriented to person, place, and time.  Psychiatric:  Behavior: Behavior normal.        Thought Content: Thought content normal.        Judgment: Judgment normal.    Assessment/Plan:    25 year old with early stage pregnancy and pregnancy of unknown location. Discussed that her beta hCG value had gone up normally but that this does not exclude ectopic pregnancy.I recommended trending beta hCG values next week.  However the patient declined and reported that she needed to be in attendance at work.  And that she generally works between the hours of 8 AM and 5 PM.  She is interested in following up for a  viability ultrasound in 2 weeks.  Discussed warning signs of ruptured ectopic pregnancy such as severe abdominal pain dizziness lightheadedness and vaginal bleeding.  Advised the patient to refrain from intercourse and remain within 30 minutes of the hospital in event of an emergency.  Discussed that she should come to the emergency room if she has any signs or symptoms of a ruptured ectopic pregnancy.  Patient plans to establish care with a different OB/GYN practice.  She reports that she has a follow-up visit planned for January 2023.  More than 20 minutes were spent face to face with the patient in the room, reviewing the medical record, labs and images, and coordinating care for the patient. The plan of management was discussed in detail and counseling was provided.      Adrian Prows MD [facility] [place] [facility] 05/16/2021 1:31 PM

## 2021-05-16 NOTE — Telephone Encounter (Signed)
Pt calling for ED blood work f/us.  [phone]  Reminded pt she has a 1:15 appt today; states she is on her way now; is unable to see her test results on MyChart and wants to know if she will get them today when she comes in.  Adv she would and to ask receptionist for number for help with her MyChart.

## 2021-05-27 ENCOUNTER — Telehealth: Payer: Self-pay

## 2021-05-27 NOTE — Telephone Encounter (Signed)
Pt calling; is 7w 4d; having issues.  [phone]  Pt has been having terrible nausea and hemorrhoids since Sunday night; adv nausea is a good sign; to take vitamin B6 10-24mg  q8hrs while awake and unisome 25mg  at bedtime and 12.5mg  in am; sip juice, gatorade, popsicles; if doesn't keep fluids down for 24hrs to be seen; for hemorrhoids - avoid constipation, colace, annusol suppositories.  To try these measures if not helpful to d/w PH at appt Fri.

## 2021-05-28 ENCOUNTER — Other Ambulatory Visit: Payer: Self-pay

## 2021-05-28 ENCOUNTER — Ambulatory Visit
Admission: RE | Admit: 2021-05-28 | Discharge: 2021-05-28 | Disposition: A | Discharge status: Home / Self Care | Payer: Medicaid Other | Source: Ambulatory Visit | Attending: Obstetrics and Gynecology | Admitting: Obstetrics and Gynecology

## 2021-05-28 DIAGNOSIS — O3680X0 Pregnancy with inconclusive fetal viability, not applicable or unspecified: Secondary | ICD-10-CM | POA: Diagnosis not present

## 2021-05-28 NOTE — Telephone Encounter (Signed)
Pt calling; wants to speak c nurse she talked c yesterday.  [phone]  Pt states she hasn't kept anything down for 24hrs; had u/s today and was throwing up during it; adv to go to ED - they can give her fluids whereas we cannot.

## 2021-05-29 ENCOUNTER — Other Ambulatory Visit: Payer: Self-pay

## 2021-05-29 ENCOUNTER — Emergency Department
Admission: EM | Admit: 2021-05-29 | Discharge: 2021-05-29 | Disposition: A | Discharge status: Home / Self Care | Payer: Medicaid Other | Attending: Emergency Medicine | Admitting: Emergency Medicine

## 2021-05-29 DIAGNOSIS — F17200 Nicotine dependence, unspecified, uncomplicated: Secondary | ICD-10-CM | POA: Insufficient documentation

## 2021-05-29 DIAGNOSIS — Z3A08 8 weeks gestation of pregnancy: Secondary | ICD-10-CM | POA: Diagnosis not present

## 2021-05-29 DIAGNOSIS — O210 Mild hyperemesis gravidarum: Secondary | ICD-10-CM | POA: Insufficient documentation

## 2021-05-29 LAB — COMPREHENSIVE METABOLIC PANEL
ALT: 13 U/L (ref 0–44)
AST: 16 U/L (ref 15–41)
Albumin: 4.4 g/dL (ref 3.5–5.0)
Alkaline Phosphatase: 56 U/L (ref 38–126)
Anion gap: 6 (ref 5–15)
BUN: 11 mg/dL (ref 6–20)
CO2: 24 mmol/L (ref 22–32)
Calcium: 9.1 mg/dL (ref 8.9–10.3)
Chloride: 104 mmol/L (ref 98–111)
Creatinine, Ser: 0.66 mg/dL (ref 0.44–1.00)
GFR, Estimated: >60 mL/min (ref >60)
Glucose, Bld: 98 mg/dL (ref 70–99)
Potassium: 3.4 mmol/L — ABNORMAL LOW (ref 3.5–5.1)
Sodium: 134 mmol/L — ABNORMAL LOW (ref 135–145)
Total Bilirubin: 0.8 mg/dL (ref 0.3–1.2)
Total Protein: 7.4 g/dL (ref 6.5–8.1)

## 2021-05-29 LAB — CBC WITH DIFFERENTIAL/PLATELET
Abs Immature Granulocytes: 0.03 10*3/uL (ref 0.00–0.07)
Basophils Absolute: 0.1 10*3/uL (ref 0.0–0.1)
Basophils Relative: 1 %
Eosinophils Absolute: 0.1 10*3/uL (ref 0.0–0.5)
Eosinophils Relative: 0 %
HCT: 44.4 % (ref 36.0–46.0)
Hemoglobin: 15.1 g/dL — ABNORMAL HIGH (ref 12.0–15.0)
Immature Granulocytes: 0 %
Lymphocytes Relative: 22 %
Lymphs Abs: 2.6 10*3/uL (ref 0.7–4.0)
MCH: 27.7 pg (ref 26.0–34.0)
MCHC: 34 g/dL (ref 30.0–36.0)
MCV: 81.3 fL (ref 80.0–100.0)
Monocytes Absolute: 0.9 10*3/uL (ref 0.1–1.0)
Monocytes Relative: 8 %
Neutro Abs: 8 10*3/uL — ABNORMAL HIGH (ref 1.7–7.7)
Neutrophils Relative %: 69 %
Platelets: 347 10*3/uL (ref 150–400)
RBC: 5.46 MIL/uL — ABNORMAL HIGH (ref 3.87–5.11)
RDW: 13.1 % (ref 11.5–15.5)
WBC: 11.7 10*3/uL — ABNORMAL HIGH (ref 4.0–10.5)
nRBC: 0 % (ref 0.0–0.2)

## 2021-05-29 MED ORDER — ONDANSETRON HCL 4 MG/2ML IJ SOLN
4.0000 mg | Freq: Once | INTRAMUSCULAR | Status: AC
  Administered 2021-05-29: 14:00:00 4 mg via INTRAVENOUS
  Filled 2021-05-29: qty 2

## 2021-05-29 MED ORDER — ONDANSETRON 8 MG PO TBDP: 8.0000 mg | ORAL_TABLET | Freq: Three times a day (TID) | ORAL | 0 refills | Status: DC | PRN

## 2021-05-29 MED ORDER — DOXYLAMINE-PYRIDOXINE 10-10 MG PO TBEC: 2.0000 | DELAYED_RELEASE_TABLET | Freq: Every day | ORAL | 0 refills | Status: DC

## 2021-05-29 MED ORDER — SODIUM CHLORIDE 0.9 % IV BOLUS
1000.0000 mL | Freq: Once | INTRAVENOUS | Status: AC
  Administered 2021-05-29: 14:00:00 1000 mL via INTRAVENOUS

## 2021-05-29 MED ORDER — SODIUM CHLORIDE 0.9 % IV BOLUS
1000.0000 mL | Freq: Once | INTRAVENOUS | Status: AC
  Administered 2021-05-29: 13:00:00 1000 mL via INTRAVENOUS

## 2021-05-29 NOTE — ED Notes (Signed)
Pt. Provided AJ for PO challenge

## 2021-05-29 NOTE — ED Notes (Signed)
Pt. Passed PO test without complication, MD notified.

## 2021-05-29 NOTE — ED Provider Notes (Signed)
[facility] Emergency Department Provider Note   ____________________________________________   Event Date/Time   First MD Initiated Contact with Patient 05/29/21 1308     (approximate)  I have reviewed the triage vital signs and the nursing notes.  HISTORY  Chief Complaint Emesis During Pregnancy  HPI Sylvia Parks is a 25 y.o. female with a stated past medical history of [redacted] weeks gestation who presents for 2 days of nausea/vomiting  LOCATION: Abdomen DURATION: 2 days prior to arrival TIMING: Stable since onset SEVERITY: Severe QUALITY: Nausea/vomiting CONTEXT: Patient states that she began having morning sickness 2 days prior to arrival but has been unable to keep down anything since that time.  Patient has not tried any medications for the symptoms MODIFYING FACTORS: Any p.o. intake worsens her nausea and is partially relieved after vomiting ASSOCIATED SYMPTOMS: Denies any abdominal pain, cramping, or vaginal bleeding   Per medical record review, patient has history of endometriosis          Past Medical History:  Diagnosis Date   Endometriosis     There are no problems to display for this patient.   Past Surgical History:  Procedure Laterality Date   ADENOIDECTOMY      Prior to Admission medications   Medication Sig Start Date End Date Taking? Authorizing Provider  acetaminophen (TYLENOL) 500 MG tablet Take 500 mg by mouth every 6 (six) hours as needed. Patient not taking: Reported on 05/16/2021    [provider]  Doxylamine-Pyridoxine 10-10 MG TBEC Take 2 tablets by mouth at bedtime. 05/29/21 06/28/21  Merwyn Katos, MD  metroNIDAZOLE (FLAGYL) 500 MG tablet Take 1 tablet (500 mg total) by mouth 2 (two) times daily. Patient not taking: Reported on 05/16/2021 12/30/20   Becky Augusta, NP  ondansetron (ZOFRAN-ODT) 8 MG disintegrating tablet Take 1 tablet (8 mg total) by mouth every 8 (eight) hours as needed for up to 3 days for  nausea or vomiting. 05/29/21 06/01/21  Merwyn Katos, MD    Allergies Patient has no known allergies.  No family history on file.  Social History Social History   Tobacco Use   Smoking status: Former   Smokeless tobacco: Never  Vaping Use   Vaping Use: Former   Quit date: 05/01/2021  Substance Use Topics   Alcohol use: No   Drug use: Never    Review of Systems Constitutional: No fever/chills Eyes: No visual changes. ENT: No sore throat. Cardiovascular: Denies chest pain. Respiratory: Denies shortness of breath. Gastrointestinal: No abdominal pain.  Endorses acute nausea/vomiting.  No diarrhea. Genitourinary: Negative for dysuria. Musculoskeletal: Negative for acute arthralgias Skin: Negative for rash. Neurological: Negative for headaches, weakness/numbness/paresthesias in any extremity Psychiatric: Negative for suicidal ideation/homicidal ideation   ____________________________________________   PHYSICAL EXAM:  VITAL SIGNS: ED Triage Vitals  Enc Vitals Group     BP 05/29/21 1222 (!) 128/94     Pulse Rate 05/29/21 1222 (!) 113     Resp 05/29/21 1222 16     Temp 05/29/21 1222 97.7 F (36.5 C)     Temp Source 05/29/21 1222 Oral     SpO2 05/29/21 1222 97 %     Weight --      Height --      Head Circumference --      Peak Flow --      Pain Score 05/29/21 1213 0     Pain Loc --      Pain Edu? --  Excl. in GC? --    Constitutional: Alert and oriented. Well appearing and in no acute distress. Eyes: Conjunctivae are normal. PERRL. Head: Atraumatic. Nose: No congestion/rhinnorhea. Mouth/Throat: Mucous membranes are moist. Neck: No stridor Cardiovascular: Grossly normal heart sounds.  Good peripheral circulation. Respiratory: Normal respiratory effort.  No retractions. Gastrointestinal: Soft and nontender. No distention. Musculoskeletal: No obvious deformities Neurologic:  Normal speech and language. No gross focal neurologic deficits are  appreciated. Skin:  Skin is warm and dry. No rash noted. Psychiatric: Mood and affect are normal. Speech and behavior are normal.  ____________________________________________   LABS (all labs ordered are listed, but only abnormal results are displayed)  Labs Reviewed  CBC WITH DIFFERENTIAL/PLATELET - Abnormal; Notable for the following components:      Result Value   WBC 11.7 (*)    RBC 5.46 (*)    Hemoglobin 15.1 (*)    Neutro Abs 8.0 (*)    All other components within normal limits  COMPREHENSIVE METABOLIC PANEL - Abnormal; Notable for the following components:   Sodium 134 (*)    Potassium 3.4 (*)    All other components within normal limits  URINALYSIS, ROUTINE W REFLEX MICROSCOPIC   ____________________________________________   PROCEDURES  Procedure(s) performed (including Critical Care):  Procedures   ____________________________________________   INITIAL IMPRESSION / ASSESSMENT AND PLAN / ED COURSE  As part of my medical decision making, I reviewed the following data within the electronic medical record, if available:  Nursing notes reviewed and incorporated, Labs reviewed, EKG interpreted, Old chart reviewed, Radiograph reviewed and Notes from prior ED visits reviewed and incorporated      Patient presents for acute nausea/vomiting The cause of the patients symptoms is not clear, but the patient is overall well appearing and is suspected to have a transient course of illness.  Given History and Exam there does not appear to be an emergent cause of the symptoms such as small bowel obstruction, coronary syndrome, bowel ischemia, DKA, pancreatitis, appendicitis, other acute abdomen or other emergent problem.  Reassessment: After treatment, the patient is feeling much better, tolerating PO fluids, and shows no signs of dehydration.   Disposition: Discharge home with prompt primary care physician follow up in the next 48 hours. Strict return precautions  discussed.      ____________________________________________   FINAL CLINICAL IMPRESSION(S) / ED DIAGNOSES  Final diagnoses:  Hyperemesis gravidarum     ED Discharge Orders          Ordered    ondansetron (ZOFRAN-ODT) 8 MG disintegrating tablet  Every 8 hours PRN,   Status:  Discontinued        05/29/21 1610    Doxylamine-Pyridoxine 10-10 MG TBEC  Daily at bedtime,   Status:  Discontinued        05/29/21 1610    Doxylamine-Pyridoxine 10-10 MG TBEC  Daily at bedtime,   Status:  Discontinued        05/29/21 1619    ondansetron (ZOFRAN-ODT) 8 MG disintegrating tablet  Every 8 hours PRN,   Status:  Discontinued        05/29/21 1619    Doxylamine-Pyridoxine 10-10 MG TBEC  Daily at bedtime        05/29/21 1621    ondansetron (ZOFRAN-ODT) 8 MG disintegrating tablet  Every 8 hours PRN        05/29/21 1621             Note:  This document was prepared using Dragon voice recognition software and may  include unintentional dictation errors.    Merwyn Katos, MD 05/29/21 1726

## 2021-05-29 NOTE — ED Triage Notes (Signed)
Pt states she is [redacted] weeks pregnant and has not been able to keep any food or fluids down for the past 5 days. Denies any abd pain or other sx, is a pt of [facility]

## 2021-05-30 ENCOUNTER — Ambulatory Visit (INDEPENDENT_AMBULATORY_CARE_PROVIDER_SITE_OTHER): Payer: Medicaid Other | Admitting: Obstetrics & Gynecology

## 2021-05-30 ENCOUNTER — Encounter: Payer: Self-pay | Admitting: Obstetrics & Gynecology

## 2021-05-30 VITALS — BP 126/74 | Wt 146.0 lb

## 2021-05-30 DIAGNOSIS — R1110 Vomiting, unspecified: Secondary | ICD-10-CM

## 2021-05-30 MED ORDER — ONDANSETRON 8 MG PO TBDP: 8.0000 mg | ORAL_TABLET | Freq: Three times a day (TID) | ORAL | 2 refills | Status: AC | PRN

## 2021-05-30 NOTE — Progress Notes (Signed)
You are seeing this patient at 4:30 today- could you explain her [place] results and order her a 2 week follow up US- sounds like radiology may have a concern for heterotopic pregnancy.

## 2021-05-30 NOTE — Progress Notes (Signed)
°  Obstetric Problem Visit   Chief Complaint: Nausea in pregnancy  History of Present Illness: Patient is a 25 y.o. G2P0010 [redacted]w[redacted]d presenting for continued to chack on this pregnancy w eay severe nausea and also [place] earlier this week w IUP confirmed and low heart rate of 105.  No pain or bleeding.  Prior pregnancy w 18 weeks loss, sound like cerv incompetence.Marland Kitchen  PMHx: She  has a past medical history of Endometriosis. Also,  has a past surgical history that includes Adenoidectomy., family history is not on file.,  reports that she has quit smoking. She has never used smokeless tobacco. She reports that she does not drink alcohol and does not use drugs.  She has a current medication list which includes the following prescription(s): acetaminophen, doxylamine-pyridoxine, metronidazole, and ondansetron. Also, has No Known Allergies.  Review of Systems  All other systems reviewed and are negative.  Objective: Vitals:   05/30/21 1624  BP: 126/74   Physical Exam Constitutional:      General: She is not in acute distress.    Appearance: She is well-developed.  Musculoskeletal:        General: Normal range of motion.  Neurological:     Mental Status: She is alert and oriented to person, place, and time.  Skin:    General: Skin is warm and dry.  Vitals reviewed.   TVUS shows FHT 120s Good AF Normal size yolk sac  Assessment: 25 y.o. G2P0010 [redacted]w[redacted]d Hyperemesis  Plan: Zofran, Diclegis Consider B6 and Reglan if does not tolerate Diclegis US shows improved FHT rate Recheck [place] 2 weeks  Plan cervical checks due to cerv incomp risks in second trimester  Annamarie Major, MD, [facility], [facility] 05/30/2021  5:04 PM

## 2021-06-06 ENCOUNTER — Telehealth: Payer: Self-pay

## 2021-06-06 ENCOUNTER — Other Ambulatory Visit: Payer: Self-pay | Admitting: Obstetrics & Gynecology

## 2021-06-06 MED ORDER — ONDANSETRON 4 MG PO TBDP: 4.0000 mg | ORAL_TABLET | Freq: Four times a day (QID) | ORAL | 0 refills | Status: DC | PRN

## 2021-06-06 NOTE — Telephone Encounter (Signed)
Pt calling to see what pharm zofran was sent to so she can p/u.  [phone]

## 2021-06-06 NOTE — Telephone Encounter (Signed)
Pt aware it was sent in to [facility] in [place].

## 2021-06-07 ENCOUNTER — Other Ambulatory Visit: Payer: Self-pay

## 2021-06-07 ENCOUNTER — Emergency Department
Admission: EM | Admit: 2021-06-07 | Discharge: 2021-06-07 | Disposition: A | Discharge status: Home / Self Care | Payer: Medicaid Other | Attending: Emergency Medicine | Admitting: Emergency Medicine

## 2021-06-07 ENCOUNTER — Encounter: Payer: Self-pay | Admitting: Emergency Medicine

## 2021-06-07 DIAGNOSIS — O210 Mild hyperemesis gravidarum: Secondary | ICD-10-CM

## 2021-06-07 DIAGNOSIS — Z87891 Personal history of nicotine dependence: Secondary | ICD-10-CM | POA: Diagnosis not present

## 2021-06-07 DIAGNOSIS — O219 Vomiting of pregnancy, unspecified: Secondary | ICD-10-CM | POA: Diagnosis not present

## 2021-06-07 DIAGNOSIS — Z3A01 Less than 8 weeks gestation of pregnancy: Secondary | ICD-10-CM | POA: Insufficient documentation

## 2021-06-07 LAB — COMPREHENSIVE METABOLIC PANEL
ALT: 14 U/L (ref 0–44)
AST: 15 U/L (ref 15–41)
Albumin: 4.6 g/dL (ref 3.5–5.0)
Alkaline Phosphatase: 57 U/L (ref 38–126)
Anion gap: 13 (ref 5–15)
BUN: 13 mg/dL (ref 6–20)
CO2: 23 mmol/L (ref 22–32)
Calcium: 9.5 mg/dL (ref 8.9–10.3)
Chloride: 98 mmol/L (ref 98–111)
Creatinine, Ser: 0.61 mg/dL (ref 0.44–1.00)
GFR, Estimated: >60 mL/min (ref >60)
Glucose, Bld: 140 mg/dL — ABNORMAL HIGH (ref 70–99)
Potassium: 3.7 mmol/L (ref 3.5–5.1)
Sodium: 134 mmol/L — ABNORMAL LOW (ref 135–145)
Total Bilirubin: 0.9 mg/dL (ref 0.3–1.2)
Total Protein: 7.7 g/dL (ref 6.5–8.1)

## 2021-06-07 LAB — CBC WITH DIFFERENTIAL/PLATELET
Abs Immature Granulocytes: 0.08 10*3/uL — ABNORMAL HIGH (ref 0.00–0.07)
Basophils Absolute: 0.1 10*3/uL (ref 0.0–0.1)
Basophils Relative: 0 %
Eosinophils Absolute: 0 10*3/uL (ref 0.0–0.5)
Eosinophils Relative: 0 %
HCT: 46.1 % — ABNORMAL HIGH (ref 36.0–46.0)
Hemoglobin: 15.8 g/dL — ABNORMAL HIGH (ref 12.0–15.0)
Immature Granulocytes: 1 %
Lymphocytes Relative: 10 %
Lymphs Abs: 1.7 10*3/uL (ref 0.7–4.0)
MCH: 27.7 pg (ref 26.0–34.0)
MCHC: 34.3 g/dL (ref 30.0–36.0)
MCV: 80.9 fL (ref 80.0–100.0)
Monocytes Absolute: 0.8 10*3/uL (ref 0.1–1.0)
Monocytes Relative: 5 %
Neutro Abs: 14.1 10*3/uL — ABNORMAL HIGH (ref 1.7–7.7)
Neutrophils Relative %: 84 %
Platelets: 315 10*3/uL (ref 150–400)
RBC: 5.7 MIL/uL — ABNORMAL HIGH (ref 3.87–5.11)
RDW: 12.7 % (ref 11.5–15.5)
WBC: 16.7 10*3/uL — ABNORMAL HIGH (ref 4.0–10.5)
nRBC: 0 % (ref 0.0–0.2)

## 2021-06-07 LAB — URINALYSIS, ROUTINE W REFLEX MICROSCOPIC
Bilirubin Urine: NEGATIVE
Glucose, UA: NEGATIVE mg/dL
Hgb urine dipstick: NEGATIVE
Ketones, ur: 80 mg/dL — AB
Nitrite: NEGATIVE
Protein, ur: 100 mg/dL — AB
Specific Gravity, Urine: 1.033 — ABNORMAL HIGH (ref 1.005–1.030)
Squamous Epithelial / HPF: >50 — ABNORMAL HIGH (ref 0–5)
pH: 5 (ref 5.0–8.0)

## 2021-06-07 LAB — POC URINE PREG, ED: Preg Test, Ur: POSITIVE — AB

## 2021-06-07 MED ORDER — SODIUM CHLORIDE 0.9 % IV SOLN
12.5000 mg | Freq: Once | INTRAVENOUS | Status: AC
  Administered 2021-06-07: 12.5 mg via INTRAVENOUS
  Filled 2021-06-07: qty 0.5

## 2021-06-07 MED ORDER — SODIUM CHLORIDE 0.9 % IV SOLN
1000.0000 mL | Freq: Once | INTRAVENOUS | Status: AC
  Administered 2021-06-07: 1000 mL via INTRAVENOUS

## 2021-06-07 MED ORDER — SODIUM CHLORIDE 0.9 % IV SOLN: 12.5000 mg | Freq: Once | INTRAVENOUS | Status: DC

## 2021-06-07 MED ORDER — PROMETHAZINE HCL 25 MG PO TABS
25.0000 mg | ORAL_TABLET | Freq: Once | ORAL | Status: AC
  Administered 2021-06-07: 25 mg via ORAL
  Filled 2021-06-07: qty 1

## 2021-06-07 MED ORDER — ONDANSETRON HCL 4 MG/2ML IJ SOLN
4.0000 mg | Freq: Once | INTRAMUSCULAR | Status: AC
  Administered 2021-06-07: 4 mg via INTRAVENOUS
  Filled 2021-06-07: qty 2

## 2021-06-07 MED ORDER — PROMETHAZINE HCL 12.5 MG PO TABS: 12.5000 mg | ORAL_TABLET | Freq: Four times a day (QID) | ORAL | 0 refills | Status: DC | PRN

## 2021-06-07 NOTE — ED Provider Notes (Signed)
[facility] Emergency Department Provider Note   ____________________________________________    I have reviewed the triage vital signs and the nursing notes.   HISTORY  Chief Complaint Emesis     HPI Sylvia Parks is a 25 y.o. female with history of endometriosis who states she is approximately [redacted] weeks pregnant who presents with nausea and vomiting for the last 3 to 4 days.  She reports it is difficult to tolerate p.o.  She states that she feels weak.  Denies abdominal pain.  No vaginal bleeding.  This is her second pregnancy.  Past Medical History:  Diagnosis Date   Endometriosis     There are no problems to display for this patient.   Past Surgical History:  Procedure Laterality Date   ADENOIDECTOMY      Prior to Admission medications   Medication Sig Start Date End Date Taking? Authorizing Provider  ondansetron (ZOFRAN-ODT) 4 MG disintegrating tablet Take 1 tablet (4 mg total) by mouth every 6 (six) hours as needed for nausea. 06/06/21   Nadara Mustard, MD  acetaminophen (TYLENOL) 500 MG tablet Take 500 mg by mouth every 6 (six) hours as needed. Patient not taking: Reported on 05/16/2021    [provider]  Doxylamine-Pyridoxine 10-10 MG TBEC Take 2 tablets by mouth at bedtime. 05/29/21 06/28/21  Merwyn Katos, MD  metroNIDAZOLE (FLAGYL) 500 MG tablet Take 1 tablet (500 mg total) by mouth 2 (two) times daily. Patient not taking: Reported on 05/16/2021 12/30/20   Becky Augusta, NP     Allergies Patient has no known allergies.  History reviewed. No pertinent family history.  Social History Social History   Tobacco Use   Smoking status: Former   Smokeless tobacco: Never  Vaping Use   Vaping Use: Former   Quit date: 05/01/2021  Substance Use Topics   Alcohol use: No   Drug use: Never    Review of Systems  Constitutional: No fever/chills Eyes: No visual changes.  ENT: No sore throat. Cardiovascular: Denies chest  pain. Respiratory: Denies shortness of breath. Gastrointestinal: No abdominal pain.  Nausea as above Genitourinary: Negative for dysuria. Musculoskeletal: Negative for back pain. Skin: Negative for rash. Neurological: Negative for headaches or weakness   ____________________________________________   PHYSICAL EXAM:  VITAL SIGNS: ED Triage Vitals  Enc Vitals Group     BP 06/07/21 1143 (!) 149/84     Pulse Rate 06/07/21 1143 (!) 108     Resp 06/07/21 1143 16     Temp 06/07/21 1143 98.2 F (36.8 C)     Temp Source 06/07/21 1143 Oral     SpO2 06/07/21 1143 98 %     Weight 06/07/21 1143 66.2 kg (146 lb)     Height 06/07/21 1143 1.626 m (5\' 4" )     Head Circumference --      Peak Flow --      Pain Score 06/07/21 1204 0     Pain Loc --      Pain Edu? --      Excl. in GC? --     Constitutional: Alert and oriented. No acute distress. Pleasant and interactive Eyes: Conjunctivae are normal.  Head: Atraumatic. Nose: No congestion/rhinnorhea. Mouth/Throat: Mucous membranes are moist.   Neck:  Painless ROM Cardiovascular: Normal rate, regular rhythm.   Good peripheral circulation. Respiratory: Normal respiratory effort.  No retractions.  Gastrointestinal: Soft and nontender. No distention.    Musculoskeletal: No lower extremity tenderness nor edema.  Warm and well  perfused Neurologic:  Normal speech and language. No gross focal neurologic deficits are appreciated.  Skin:  Skin is warm, dry and intact. No rash noted. Psychiatric: Mood and affect are normal. Speech and behavior are normal.  ____________________________________________   LABS (all labs ordered are listed, but only abnormal results are displayed)  Labs Reviewed  CBC WITH DIFFERENTIAL/PLATELET - Abnormal; Notable for the following components:      Result Value   WBC 16.7 (*)    RBC 5.70 (*)    Hemoglobin 15.8 (*)    HCT 46.1 (*)    Neutro Abs 14.1 (*)    Abs Immature Granulocytes 0.08 (*)    All other  components within normal limits  COMPREHENSIVE METABOLIC PANEL - Abnormal; Notable for the following components:   Sodium 134 (*)    Glucose, Bld 140 (*)    All other components within normal limits  URINALYSIS, ROUTINE W REFLEX MICROSCOPIC - Abnormal; Notable for the following components:   Color, Urine AMBER (*)    APPearance CLOUDY (*)    Specific Gravity, Urine 1.033 (*)    Ketones, ur 80 (*)    Protein, ur 100 (*)    Leukocytes,Ua TRACE (*)    Bacteria, UA FEW (*)    Squamous Epithelial / LPF >50 (*)    All other components within normal limits  POC URINE PREG, ED - Abnormal; Notable for the following components:   Preg Test, Ur Positive (*)    All other components within normal limits   ____________________________________________  EKG  ___________________________________________  RADIOLOGY   ____________________________________________   PROCEDURES  Procedure(s) performed: No  Procedures   Critical Care performed: No ____________________________________________   INITIAL IMPRESSION / ASSESSMENT AND PLAN / ED COURSE  Pertinent labs & imaging results that were available during my care of the patient were reviewed by me and considered in my medical decision making (see chart for details).   Patient presents with nausea and vomiting likely related to hyperemesis/morning sickness abdominal exam is reassuring.  Lab work is notable for elevated white blood cell count, mild hyponatremia however this appears chronic.  Elevated white blood cell count is nonspecific.  Treat with IV Zofran, IV fluids and reevaluate  Still feeling somewhat nauseated, will treat with IV Phenergan, additional fluids and p.o. challenge.  ----------------------------------------- 6:51 PM on 06/07/2021 ----------------------------------------- Patient is feeling improved, she reports Phenergan worked better for her than Zofran, will Rx Phenergan, outpatient follow-up with her GYN, return  precautions discussed.    ____________________________________________   FINAL CLINICAL IMPRESSION(S) / ED DIAGNOSES  Final diagnoses:  Morning sickness        Note:  This document was prepared using Dragon voice recognition software and may include unintentional dictation errors.    Jene Every, MD 06/07/21 [phone]

## 2021-06-07 NOTE — ED Provider Notes (Signed)
°  Emergency Medicine Provider Triage Evaluation Note  Sylvia Parks , a 25 y.o.female,  was evaluated in triage.  Pt complains of vomiting.  Patient states that she has had over 20 episodes of vomiting over the past 4 to 5 days.  Non-bloody.  Has previously been prescribed Zofran and she has been taking that, but it has not helped.  She is [redacted] weeks pregnant.  She has experienced hyperemesis like this before during her last pregnancy, for which promethazine helped, but she is nervous to take that because ended in a miscarriage.  Denies fever/chills, abdominal pain, chest pain, shortness of breath, or urinary symptoms.  *She additionally endorses low back pain with burning sensation along her left and right thighs.   Review of Systems  Positive: Vomiting Negative: Denies fever, chest pain, vomiting  Physical Exam   Vitals:   06/07/21 1143  BP: (!) 149/84  Pulse: (!) 108  Resp: 16  Temp: 98.2 F (36.8 C)  SpO2: 98%   Gen:   Awake, appears uncomfortable. Resp:  Normal effort  MSK:   Moves extremities without difficulty  Other:    Medical Decision Making  Given the patient's initial medical screening exam, the following diagnostic evaluation has been ordered. The patient will be placed in the appropriate treatment space, once one is available, to complete the evaluation and treatment. I have discussed the plan of care with the patient and I have advised the patient that an ED physician or mid-level practitioner will reevaluate their condition after the test results have been received, as the results may give them additional insight into the type of treatment they may need.    Diagnostics: Labs, UA, pregnancy test.  Treatments: none immediately   Varney Daily, PA 06/07/21 1157    Jene Every, MD 06/07/21 1200

## 2021-06-07 NOTE — ED Triage Notes (Signed)
Pt via POV from home. Pt states she is [redacted] weeks pregnant and for the past 4 days unable to keep anything down. Pt states she is also having some lower back pain. Denies any abd pain. Denies vaginal bleeding. Pregnancy has been confirmed by a doctor. Pt is A&OX4 and NAD

## 2021-06-07 NOTE — ED Notes (Signed)
Sat patient up from supine to 45 degrees to start PO challenge. Pt reports nausea returned with shift in position and immediately began vomiting before trying to drink the water. Dr Cyril Loosen notified.

## 2021-06-07 NOTE — ED Notes (Signed)
Blood labs obtained in triage. Urine cup provided.

## 2021-06-07 NOTE — ED Notes (Signed)
Pt. Up to bathroom to obtain urine specimen, gait steady, NAD.

## 2021-06-07 NOTE — ED Notes (Signed)
Pt. Given AJ for PO challenge

## 2021-06-07 NOTE — ED Notes (Signed)
Pt. Drank AJ for PO challenge without nausea or vomiting.

## 2021-06-08 NOTE — L&D Delivery Note (Signed)
     Delivery Note   Sylvia Parks is a 26 y.o. G2P0010 at [redacted]w[redacted]d Estimated Date of Delivery: 01/22/22  PRE-OPERATIVE DIAGNOSIS:  1) [redacted]w[redacted]d pregnancy.  FGR  POST-OPERATIVE DIAGNOSIS:  1) [redacted]w[redacted]d pregnancy s/p Vaginal, Spontaneous  SGA infant   Delivery Type: Vaginal, Spontaneous    Delivery Anesthesia: None   Labor Complications:  None    ESTIMATED BLOOD LOSS: 150 ml    FINDINGS:   1) female infant, "Beau," Apgar scores of 9   at 1 minute and 10   at 5 minutes and a birthweight of 6 lbs.    SPECIMENS:   PLACENTA:   Appearance: Intact    Removal: Spontaneous      Disposition:  Per protocol  CORD BLOOD: Not Indicated  DISPOSITION:  Infant left in stable condition in the delivery room, with L&D personnel and mother,  NARRATIVE SUMMARY: Labor course:  Sylvia Parks is a G2P0010 at [redacted]w[redacted]d who presented to Labor & Delivery for labor management. Her initial cervical exam was 3/90/-3. Labor proceeded with augmentation and she was found to be completely dilated at 2107. With excellent maternal pushing effort, she birthed a viable female infant at 2159. A nuchal cord was unable to be reduced, and the infant was somersaulted through. The shoulders were birthed without difficulty. The infant was placed skin-to-skin with Sylvia Parks. The cord was doubly clamped and cut by the father when pulsations ceased. The placenta delivered spontaneously and was noted to be intact with a 3VC. A perineal and vaginal examination was performed. Episiotomy/Lacerations: small 2nd degree vaginal, left labial Lacerations were repaired with 3-0 Vicryl suture using local anesthesia and IV fentanyl. The patient tolerated this well. Mother and baby were left in stable condition.   Sylvia Parks, CNM 01/08/2022 11:33 PM

## 2021-06-10 ENCOUNTER — Other Ambulatory Visit: Payer: Self-pay

## 2021-06-10 ENCOUNTER — Inpatient Hospital Stay
Admission: EM | Admit: 2021-06-10 | Discharge: 2021-06-12 | DRG: 833 | Disposition: A | Discharge status: Home / Self Care | Payer: Medicaid Other | Attending: Obstetrics & Gynecology | Admitting: Obstetrics & Gynecology

## 2021-06-10 DIAGNOSIS — O210 Mild hyperemesis gravidarum: Secondary | ICD-10-CM | POA: Diagnosis present

## 2021-06-10 DIAGNOSIS — Z3A01 Less than 8 weeks gestation of pregnancy: Secondary | ICD-10-CM

## 2021-06-10 DIAGNOSIS — Z3A08 8 weeks gestation of pregnancy: Secondary | ICD-10-CM

## 2021-06-10 DIAGNOSIS — R112 Nausea with vomiting, unspecified: Secondary | ICD-10-CM | POA: Diagnosis present

## 2021-06-10 DIAGNOSIS — Z87891 Personal history of nicotine dependence: Secondary | ICD-10-CM | POA: Diagnosis not present

## 2021-06-10 DIAGNOSIS — Z20822 Contact with and (suspected) exposure to covid-19: Secondary | ICD-10-CM | POA: Diagnosis present

## 2021-06-10 DIAGNOSIS — R1110 Vomiting, unspecified: Secondary | ICD-10-CM | POA: Diagnosis present

## 2021-06-10 DIAGNOSIS — E860 Dehydration: Secondary | ICD-10-CM

## 2021-06-10 LAB — CBC
HCT: 45.4 % (ref 36.0–46.0)
Hemoglobin: 15.8 g/dL — ABNORMAL HIGH (ref 12.0–15.0)
MCH: 28.5 pg (ref 26.0–34.0)
MCHC: 34.8 g/dL (ref 30.0–36.0)
MCV: 81.9 fL (ref 80.0–100.0)
Platelets: 299 10*3/uL (ref 150–400)
RBC: 5.54 MIL/uL — ABNORMAL HIGH (ref 3.87–5.11)
RDW: 12.8 % (ref 11.5–15.5)
WBC: 15.5 10*3/uL — ABNORMAL HIGH (ref 4.0–10.5)
nRBC: 0 % (ref 0.0–0.2)

## 2021-06-10 LAB — COMPREHENSIVE METABOLIC PANEL
ALT: 15 U/L (ref 0–44)
AST: 16 U/L (ref 15–41)
Albumin: 4.4 g/dL (ref 3.5–5.0)
Alkaline Phosphatase: 56 U/L (ref 38–126)
Anion gap: 10 (ref 5–15)
BUN: 9 mg/dL (ref 6–20)
CO2: 25 mmol/L (ref 22–32)
Calcium: 9.4 mg/dL (ref 8.9–10.3)
Chloride: 100 mmol/L (ref 98–111)
Creatinine, Ser: 0.64 mg/dL (ref 0.44–1.00)
GFR, Estimated: >60 mL/min (ref >60)
Glucose, Bld: 124 mg/dL — ABNORMAL HIGH (ref 70–99)
Potassium: 3.7 mmol/L (ref 3.5–5.1)
Sodium: 135 mmol/L (ref 135–145)
Total Bilirubin: 1 mg/dL (ref 0.3–1.2)
Total Protein: 7.9 g/dL (ref 6.5–8.1)

## 2021-06-10 LAB — LIPASE, BLOOD: Lipase: 26 U/L (ref 11–51)

## 2021-06-10 LAB — RESP PANEL BY RT-PCR (FLU A&B, COVID) ARPGX2
Influenza A by PCR: NEGATIVE
Influenza B by PCR: NEGATIVE
SARS Coronavirus 2 by RT PCR: NEGATIVE

## 2021-06-10 LAB — HCG, QUANTITATIVE, PREGNANCY: hCG, Beta Chain, Quant, S: 230145 m[IU]/mL — ABNORMAL HIGH (ref <5)

## 2021-06-10 MED ORDER — ONDANSETRON HCL 4 MG/2ML IJ SOLN
4.0000 mg | Freq: Once | INTRAMUSCULAR | Status: AC
  Administered 2021-06-10: 4 mg via INTRAVENOUS
  Filled 2021-06-10: qty 2

## 2021-06-10 MED ORDER — VITAMIN B-6 50 MG PO TABS
25.0000 mg | ORAL_TABLET | Freq: Four times a day (QID) | ORAL | Status: DC
  Administered 2021-06-11 – 2021-06-12 (×5): 25 mg via ORAL
  Filled 2021-06-10 (×10): qty 0.5

## 2021-06-10 MED ORDER — METHYLPREDNISOLONE 4 MG PO TABS
16.0000 mg | ORAL_TABLET | Freq: Every day | ORAL | Status: DC
  Administered 2021-06-11: 16 mg via ORAL
  Filled 2021-06-10 (×2): qty 4

## 2021-06-10 MED ORDER — HYDROXYZINE HCL 50 MG/ML IM SOLN
50.0000 mg | Freq: Four times a day (QID) | INTRAMUSCULAR | Status: DC | PRN
  Filled 2021-06-10: qty 1

## 2021-06-10 MED ORDER — LACTATED RINGERS IV BOLUS
1000.0000 mL | Freq: Once | INTRAVENOUS | Status: AC
  Administered 2021-06-10: 1000 mL via INTRAVENOUS

## 2021-06-10 MED ORDER — PROMETHAZINE HCL 12.5 MG RE SUPP: 12.5000 mg | Freq: Four times a day (QID) | RECTAL | 1 refills | Status: DC | PRN

## 2021-06-10 MED ORDER — SODIUM CHLORIDE 0.9 % IV SOLN
12.5000 mg | Freq: Once | INTRAVENOUS | Status: AC
  Administered 2021-06-10: 12.5 mg via INTRAVENOUS
  Filled 2021-06-10: qty 0.5

## 2021-06-10 MED ORDER — METHYLPREDNISOLONE 4 MG PO TABS
4.0000 mg | ORAL_TABLET | Freq: Every day | ORAL | Status: DC
  Filled 2021-06-10: qty 1

## 2021-06-10 MED ORDER — SODIUM CHLORIDE 0.9 % IV BOLUS
1000.0000 mL | Freq: Once | INTRAVENOUS | Status: AC
  Administered 2021-06-10: 1000 mL via INTRAVENOUS

## 2021-06-10 MED ORDER — FAMOTIDINE 20 MG PO TABS
20.0000 mg | ORAL_TABLET | Freq: Two times a day (BID) | ORAL | Status: DC
  Administered 2021-06-11 – 2021-06-12 (×2): 20 mg via ORAL
  Filled 2021-06-10 (×2): qty 1

## 2021-06-10 MED ORDER — METHYLPREDNISOLONE 4 MG PO TABS
16.0000 mg | ORAL_TABLET | Freq: Every day | ORAL | Status: DC
  Administered 2021-06-11 – 2021-06-12 (×2): 16 mg via ORAL
  Filled 2021-06-10 (×3): qty 4

## 2021-06-10 MED ORDER — METHYLPREDNISOLONE 4 MG PO TABS
8.0000 mg | ORAL_TABLET | Freq: Every day | ORAL | Status: DC
  Filled 2021-06-10: qty 2

## 2021-06-10 MED ORDER — METOCLOPRAMIDE HCL 10 MG PO TABS
10.0000 mg | ORAL_TABLET | Freq: Four times a day (QID) | ORAL | Status: DC
  Administered 2021-06-12: 10 mg via ORAL
  Filled 2021-06-10 (×7): qty 1

## 2021-06-10 MED ORDER — DEXTROSE 5 % IN LACTATED RINGERS IV BOLUS
1000.0000 mL | Freq: Once | INTRAVENOUS | Status: AC
  Administered 2021-06-10: 1000 mL via INTRAVENOUS

## 2021-06-10 MED ORDER — FOLIC ACID 1 MG PO TABS
1.0000 mg | ORAL_TABLET | Freq: Every day | ORAL | Status: DC
  Administered 2021-06-12: 1 mg via ORAL
  Filled 2021-06-10 (×2): qty 1

## 2021-06-10 MED ORDER — SODIUM CHLORIDE 0.9 % IV SOLN
8.0000 mg | Freq: Three times a day (TID) | INTRAVENOUS | Status: DC | PRN
  Filled 2021-06-10: qty 4

## 2021-06-10 MED ORDER — METOCLOPRAMIDE HCL 5 MG/ML IJ SOLN
10.0000 mg | Freq: Once | INTRAMUSCULAR | Status: AC
  Administered 2021-06-10: 10 mg via INTRAVENOUS
  Filled 2021-06-10: qty 2

## 2021-06-10 MED ORDER — PROMETHAZINE HCL 25 MG RE SUPP
12.5000 mg | RECTAL | Status: DC | PRN
  Filled 2021-06-10: qty 1

## 2021-06-10 MED ORDER — SODIUM CHLORIDE 0.9 % IV SOLN: Freq: Once | INTRAVENOUS | Status: AC

## 2021-06-10 MED ORDER — HYDROXYZINE HCL 25 MG PO TABS: 50.0000 mg | ORAL_TABLET | Freq: Four times a day (QID) | ORAL | Status: DC | PRN

## 2021-06-10 MED ORDER — THIAMINE HCL 100 MG PO TABS
100.0000 mg | ORAL_TABLET | Freq: Every day | ORAL | Status: DC
  Administered 2021-06-12: 100 mg via ORAL
  Filled 2021-06-10 (×2): qty 1

## 2021-06-10 MED ORDER — PROMETHAZINE HCL 25 MG PO TABS
12.5000 mg | ORAL_TABLET | ORAL | Status: DC | PRN
  Filled 2021-06-10: qty 1

## 2021-06-10 MED ORDER — FAMOTIDINE IN NACL 20-0.9 MG/50ML-% IV SOLN
20.0000 mg | Freq: Two times a day (BID) | INTRAVENOUS | Status: DC
  Administered 2021-06-11 (×2): 20 mg via INTRAVENOUS
  Filled 2021-06-10 (×5): qty 50

## 2021-06-10 MED ORDER — THIAMINE HCL 100 MG/ML IJ SOLN
100.0000 mg | Freq: Every day | INTRAMUSCULAR | Status: DC
  Administered 2021-06-11 (×2): 100 mg via INTRAVENOUS
  Filled 2021-06-10 (×3): qty 1

## 2021-06-10 MED ORDER — FOLIC ACID 5 MG/ML IJ SOLN
1.0000 mg | Freq: Every day | INTRAMUSCULAR | Status: DC
  Administered 2021-06-11 (×2): 1 mg via INTRAVENOUS
  Filled 2021-06-10 (×5): qty 0.2

## 2021-06-10 MED ORDER — ONDANSETRON 4 MG PO TBDP: 4.0000 mg | ORAL_TABLET | Freq: Three times a day (TID) | ORAL | Status: DC | PRN

## 2021-06-10 MED ORDER — METOCLOPRAMIDE HCL 5 MG/ML IJ SOLN
10.0000 mg | Freq: Four times a day (QID) | INTRAMUSCULAR | Status: DC
  Administered 2021-06-11 – 2021-06-12 (×5): 10 mg via INTRAVENOUS
  Filled 2021-06-10 (×8): qty 2

## 2021-06-10 MED ORDER — METHYLPREDNISOLONE SODIUM SUCC 125 MG IJ SOLR
48.0000 mg | Freq: Once | INTRAMUSCULAR | Status: AC
  Administered 2021-06-11: 48 mg via INTRAVENOUS
  Filled 2021-06-10: qty 0.77

## 2021-06-10 MED ORDER — ONDANSETRON HCL 4 MG/2ML IJ SOLN
4.0000 mg | Freq: Three times a day (TID) | INTRAMUSCULAR | Status: DC | PRN
  Administered 2021-06-10: 4 mg via INTRAVENOUS
  Filled 2021-06-10: qty 2

## 2021-06-10 MED ORDER — DEXTROSE-NACL 5-0.45 % IV SOLN: INTRAVENOUS | Status: DC

## 2021-06-10 NOTE — ED Triage Notes (Signed)
Pt is having hyperemesis with pregnancy, pt is 8 weeks was here 4 days ago with the same and was referred back to the ED today from her OB. Pt states she has not been able to keep anything down in a week, not able to keep the promethazine down for nausea.

## 2021-06-10 NOTE — ED Provider Notes (Signed)
Patient received in signout from Dr. Larinda Buttery pending reassessment.  Patient still with persistent nausea vomiting despite 3 L of IV fluids the third being D5LR.  Have ordered IV Reglan.  No performed bedside ultrasound to confirm fetal heart tones heart tones are 170 rate.  Still awaiting urinalysis she has not made urine yet.  Concerned she may require hospitalization for IV hydration.  Patient has not made urine in over 8 hours despite 3 L of IV fluid bolus.  Given her presentation failed outpatient management discussed case in consultation with Dr. Tiburcio Pea of OB/GYN as I do suspect she will require hospitalization for fluids further symptomatic treatment.  Patient agreeable to plan   Willy Eddy, MD 06/10/21 [phone]

## 2021-06-10 NOTE — H&P (Signed)
Obstetrics & Gynecology History and Physical Note  Date of ER Consultation: 06/10/2021   Primary OBGYN: [facility] [place]  Reason for Consultation: Nausea/Vomiting pregnancy  History of Present Illness: Ms. Sylvia Parks is a 26 y.o. G2P0010 (Patient's last menstrual period was 01/14/2021 (exact date).), with the above CC. She is 7 5/[redacted] weeks EGA w known singleton IUP with worsening n/v, unable to keep anything down.  She has tried ParamedicDiclegis and Zofran as outpatient therapy.  She has tried other meds here in ER without success.  No pain or bleeding. No abdominal pain.  No fever.  ROS: A review of systems was performed and was complete and comprehensive, except as stated in the above HPI.  OBGYN History: As per HPI. OB History     Gravida  2   Para      Term      Preterm      AB  1   Living         SAB  1   IAB      Ectopic      Multiple      Live Births               Past Medical History: Past Medical History:  Diagnosis Date   Endometriosis     Past Surgical History: Past Surgical History:  Procedure Laterality Date   ADENOIDECTOMY      Family History:  No family history on file. She denies any female cancers, bleeding or blood clotting disorders.   Social History:  Social History   Socioeconomic History   Marital status: Single    Spouse name: Not on file   Number of children: Not on file   Years of education: Not on file   Highest education level: Not on file  Occupational History   Not on file  Tobacco Use   Smoking status: Former   Smokeless tobacco: Never  Vaping Use   Vaping Use: Former   Quit date: 05/01/2021  Substance and Sexual Activity   Alcohol use: No   Drug use: Never   Sexual activity: Yes    Partners: Male  Other Topics Concern   Not on file  Social History Narrative   Not on file   Social Determinants of Health   Financial Resource Strain: Not on file  Food Insecurity: Not on file   Transportation Needs: Not on file  Physical Activity: Not on file  Stress: Not on file  Social Connections: Not on file  Intimate Partner Violence: Not on file    Allergy: No Known Allergies  Current Outpatient Medications: (Not in a hospital admission)   Hospital Medications: Current Facility-Administered Medications  Medication Dose Route Frequency Provider Last Rate Last Admin   dextrose 5 %-0.45 % sodium chloride infusion   Intravenous Continuous Nadara MustardHarris, Yonna Alwin P, MD       famotidine (PEPCID) tablet 20 mg  20 mg Oral Q12H Nadara MustardHarris, Dinisha Cai P, MD       Or   famotidine (PEPCID) IVPB 20 mg premix  20 mg Intravenous Q12H Nadara MustardHarris, Venna Berberich P, MD       folic acid (FOLVITE) tablet 1 mg  1 mg Oral Daily Nadara MustardHarris, Dawne Casali P, MD       Or   folic acid injection 1 mg  1 mg Intravenous Daily Nadara MustardHarris, Raley Novicki P, MD       hydrOXYzine (ATARAX) tablet 50 mg  50 mg Oral Q6H PRN Nadara MustardHarris, Yitzchak Kothari P, MD  Or   hydrOXYzine (VISTARIL) injection 50 mg  50 mg Intramuscular Q6H PRN Nadara Mustard, MD       [START ON 06/11/2021] methylPREDNISolone (MEDROL) tablet 16 mg  16 mg Oral Q breakfast Nadara Mustard, MD       Followed by   Melene Muller ON 06/15/2021] methylPREDNISolone (MEDROL) tablet 8 mg  8 mg Oral Q breakfast Nadara Mustard, MD       Followed by   Melene Muller ON 06/22/2021] methylPREDNISolone (MEDROL) tablet 4 mg  4 mg Oral Q breakfast Nadara Mustard, MD       [START ON 06/11/2021] methylPREDNISolone (MEDROL) tablet 16 mg  16 mg Oral Q1400 Nadara Mustard, MD       Followed by   Melene Muller ON 06/13/2021] methylPREDNISolone (MEDROL) tablet 8 mg  8 mg Oral Q1400 Nadara Mustard, MD       Followed by   Melene Muller ON 06/16/2021] methylPREDNISolone (MEDROL) tablet 4 mg  4 mg Oral Q1400 Nadara Mustard, MD       [START ON 06/11/2021] methylPREDNISolone (MEDROL) tablet 16 mg  16 mg Oral QHS Nadara Mustard, MD       Followed by   Melene Muller ON 06/14/2021] methylPREDNISolone (MEDROL) tablet 8 mg  8 mg Oral QHS Nadara Mustard,  MD       Followed by   Melene Muller ON 06/17/2021] methylPREDNISolone (MEDROL) tablet 4 mg  4 mg Oral QHS Nadara Mustard, MD       methylPREDNISolone sodium succinate (SOLU-MEDROL) 125 mg/2 mL injection 48 mg  48 mg Intravenous Once Nadara Mustard, MD       metoCLOPramide (REGLAN) tablet 10 mg  10 mg Oral Q6H Nadara Mustard, MD       Or   metoCLOPramide (REGLAN) injection 10 mg  10 mg Intravenous Q6H Nadara Mustard, MD       ondansetron (ZOFRAN-ODT) disintegrating tablet 4-8 mg  4-8 mg Oral Q8H PRN Nadara Mustard, MD       Or   ondansetron [facility]) injection 4 mg  4 mg Intravenous Q8H PRN Nadara Mustard, MD       Or   ondansetron (ZOFRAN) 8 mg in sodium chloride 0.9 % 50 mL IVPB  8 mg Intravenous Q8H PRN Nadara Mustard, MD       promethazine (PHENERGAN) tablet 12.5-25 mg  12.5-25 mg Oral Q4H PRN Nadara Mustard, MD       Or   promethazine (PHENERGAN) suppository 12.5-25 mg  12.5-25 mg Rectal Q4H PRN Nadara Mustard, MD       pyridOXINE (VITAMIN B-6) tablet 25 mg  25 mg Oral QID Nadara Mustard, MD       thiamine tablet 100 mg  100 mg Oral Daily Nadara Mustard, MD       Or   thiamine (B-1) injection 100 mg  100 mg Intravenous Daily Nadara Mustard, MD       Current Outpatient Medications  Medication Sig Dispense Refill   acetaminophen (TYLENOL) 500 MG tablet Take 500 mg by mouth every 6 (six) hours as needed.     Doxylamine-Pyridoxine 10-10 MG TBEC Take 2 tablets by mouth at bedtime. 60 tablet 0   metroNIDAZOLE (FLAGYL) 500 MG tablet Take 1 tablet (500 mg total) by mouth 2 (two) times daily. 14 tablet 0   ondansetron (ZOFRAN-ODT) 4 MG disintegrating tablet Take 1 tablet (4 mg total) by mouth every 6 (six) hours as needed  for nausea. 20 tablet 0   promethazine (PHENERGAN) 12.5 MG suppository Place 1 suppository (12.5 mg total) rectally every 6 (six) hours as needed for nausea or vomiting. 12 each 1    Physical Exam: Vitals:   06/10/21 1730 06/10/21 1800 06/10/21 1815  06/10/21 1830  BP: (!) 102/49 (!) 108/56  (!) 103/54  Pulse: (!) 57 (!) 57 (!) 54 (!) 58  Resp: 18 18  18   Temp:      TempSrc:      SpO2: 100% 100% 100% 99%  Weight:      Height:        Temp:  [98.4 F (36.9 C)] 98.4 F (36.9 C) (01/03 1049) Pulse Rate:  [51-117] 58 (01/03 1830) Resp:  [16-18] 18 (01/03 1830) BP: (102-153)/(49-104) 103/54 (01/03 1830) SpO2:  [96 %-100 %] 99 % (01/03 1830) Weight:  [66.2 kg] 66.2 kg (01/03 1055) No intake/output data recorded. No intake/output data recorded. No intake or output data in the 24 hours ending 06/10/21 2038  Body mass index is 25.06 kg/m. Constitutional: Well nourished, well developed female in no acute distress.  HEENT: normal Neck:  Supple, normal appearance, and no thyromegaly  Cardiovascular:Regular rate and rhythm.  No murmurs, rubs or gallops. Respiratory:  Clear to auscultation bilateral. Normal respiratory effort Abdomen: positive bowel sounds and no masses, hernias; diffusely non tender to palpation, non distended Neuro: grossly intact Psych:  Normal mood and affect.  Skin:  Warm and dry.  MS: normal gait and normal bilateral lower extremity strength/ROM/symmetry Lymphatic:  No inguinal lymphadenopathy.   Recent Labs  Lab 06/07/21 1145 06/10/21 1057  WBC 16.7* 15.5*  HGB 15.8* 15.8*  HCT 46.1* 45.4  PLT 315 299   Recent Labs  Lab 06/07/21 1145 06/10/21 1057  NA 134* 135  K 3.7 3.7  CL 98 100  CO2 23 25  BUN 13 9  CREATININE 0.61 0.64  CALCIUM 9.5 9.4  PROT 7.7 7.9  BILITOT 0.9 1.0  ALKPHOS 57 56  ALT 14 15  AST 15 16  GLUCOSE 140* 124*   Assessment: Ms. Shackleford is a 26 y.o. G2P0010 (Patient's last menstrual period was 01/14/2021 (exact date).) who presented to the ED with complaints of n/v; findings are consistent with hyperemesis gravidarum.  Plan: Steroid taper. Reglan and B6. Zofran or other antiemetics. IVF Rest Vitamins.  03/16/2021, MD, [facility], [facility] 06/10/2021  8:38 PM Pager [phone]

## 2021-06-10 NOTE — ED Notes (Signed)
Pt's boyfriend came out into hallway and reported the Pt has vomited up the crackers.  Will notify EDP.

## 2021-06-10 NOTE — ED Notes (Signed)
Pt given saltine crackers and ginger ale per EDP request.

## 2021-06-10 NOTE — ED Provider Notes (Signed)
[facility] Provider Note    Event Date/Time   First MD Initiated Contact with Patient 06/10/21 1412     (approximate)   History   Chief Complaint Emesis During Pregnancy   HPI  Sylvia Parks is a 26 y.o. female, G2 P0010 at approximately 8 weeks of pregnancy, who presents to the ED complaining of nausea and vomiting.  Patient reports that for the past couple weeks she has been dealing with persistent nausea and multiple episodes of vomiting.  She has been seen in the ED for this on 2 separate occasions, states symptoms were not alleviated by Zofran but did improve when treated with IV Phenergan.  She was prescribed Phenergan for use at home, but states she has been unable to keep this down for the Past couple of days.  She reports not being able to keep down either liquids or solids but denies any associated abdominal pain.  She has not had any dysuria, hematuria, fever, or flank pain.  She has not noticed any vaginal bleeding or discharge.  She spoke with her OB/GYN's office earlier today, who recommended she come to the ED for further evaluation.     Physical Exam   Triage Vital Signs: ED Triage Vitals  Enc Vitals Group     BP 06/10/21 1049 (!) 153/104     Pulse Rate 06/10/21 1049 (!) 117     Resp 06/10/21 1049 18     Temp 06/10/21 1049 98.4 F (36.9 C)     Temp Source 06/10/21 1049 Oral     SpO2 06/10/21 1049 96 %     Weight 06/10/21 1055 146 lb (66.2 kg)     Height 06/10/21 1055 5\' 4"  (1.626 m)     Head Circumference --      Peak Flow --      Pain Score 06/10/21 1054 3     Pain Loc --      Pain Edu? --      Excl. in [place]? --     Most recent vital signs: Vitals:   06/10/21 1049 06/10/21 1432  BP: (!) 153/104 112/64  Pulse: (!) 117 69  Resp: 18 18  Temp: 98.4 F (36.9 C)   SpO2: 96% 100%    Constitutional: Alert and oriented. Eyes: Conjunctivae are normal. Head: Atraumatic. Nose: No congestion/rhinnorhea. Mouth/Throat: Mucous  membranes are moist.  Cardiovascular: Normal rate, regular rhythm. Grossly normal heart sounds.  2+ radial pulses bilaterally. Respiratory: Normal respiratory effort.  No retractions. Lungs CTAB. Gastrointestinal: Soft and nontender. No distention. Genitourinary: Deferred. Musculoskeletal: No lower extremity tenderness nor edema.  Neurologic:  Normal speech and language. No gross focal neurologic deficits are appreciated.    ED Results / Procedures / Treatments   Labs (all labs ordered are listed, but only abnormal results are displayed) Labs Reviewed  COMPREHENSIVE METABOLIC PANEL - Abnormal; Notable for the following components:      Result Value   Glucose, Bld 124 (*)    All other components within normal limits  CBC - Abnormal; Notable for the following components:   WBC 15.5 (*)    RBC 5.54 (*)    Hemoglobin 15.8 (*)    All other components within normal limits  HCG, QUANTITATIVE, PREGNANCY - Abnormal; Notable for the following components:   hCG, Beta Chain, Quant, S 230,145 (*)    All other components within normal limits  LIPASE, BLOOD  URINALYSIS, ROUTINE W REFLEX MICROSCOPIC    PROCEDURES:  Critical Care performed:  No  Procedures   MEDICATIONS ORDERED IN ED: Medications  promethazine (PHENERGAN) 12.5 mg in sodium chloride 0.9 % 50 mL IVPB (has no administration in time range)  lactated ringers bolus 1,000 mL (has no administration in time range)  sodium chloride 0.9 % bolus 1,000 mL (0 mLs Intravenous Stopped 06/10/21 1414)  ondansetron (ZOFRAN) injection 4 mg (4 mg Intravenous Given 06/10/21 1108)     IMPRESSION / MDM / ASSESSMENT AND PLAN / ED COURSE  I reviewed the triage vital signs and the nursing notes.                              26 y.o. female, G2 P0-0-1-0 at approximately 8 weeks of pregnancy who presents to the ED complaining of persistent nausea and vomiting over the past couple of days not alleviated by oral Phenergan.  She denies any abdominal  pain and has benign abdominal exam, has not had any vaginal bleeding or discharge.  She had prior ultrasound this pregnancy showing intrauterine pregnancy.  Differential diagnosis includes, but is not limited to, hyperemesis gravidarum, dehydration, electrolyte abnormality, UTI.  Labs completed from triage are reassuring with no evidence of AKI or electrolyte abnormality.  CBC shows no anemia or other acute abnormality, she does have nonspecific leukocytosis but low suspicion for infectious process at this time.  Patient was given IV fluids and IV Zofran in triage without significant improvement, we will now treat with IV Phenergan and give additional fluids.  Chart reviewed from prior OB/GYN visit and patient without any pregnancy complications thus far outside of frequent nausea and vomiting.  If patient is able to tolerate oral intake following Phenergan, she would be appropriate for outpatient management and close OB/GYN follow-up with prescription for Phenergan suppository.  Patient turned over to oncoming divider pending treatment and reassessment.       FINAL CLINICAL IMPRESSION(S) / ED DIAGNOSES   Final diagnoses:  Hyperemesis gravidarum     Rx / DC Orders   ED Discharge Orders          Ordered    promethazine (PHENERGAN) 12.5 MG suppository  Every 6 hours PRN        06/10/21 1524             Note:  This document was prepared using Dragon voice recognition software and may include unintentional dictation errors.   Blake Divine, MD 06/10/21 [phone]

## 2021-06-11 LAB — HEMOGLOBIN A1C
Hgb A1c MFr Bld: 4.7 % — ABNORMAL LOW (ref 4.8–5.6)
Mean Plasma Glucose: 88.19 mg/dL

## 2021-06-11 LAB — GLUCOSE, CAPILLARY: Glucose-Capillary: 109 mg/dL — ABNORMAL HIGH (ref 70–99)

## 2021-06-11 LAB — GLUCOSE, RANDOM: Glucose, Bld: 178 mg/dL — ABNORMAL HIGH (ref 70–99)

## 2021-06-11 MED ORDER — LACTATED RINGERS IV SOLN: INTRAVENOUS | Status: DC

## 2021-06-11 MED ORDER — SODIUM CHLORIDE 0.9 % IV SOLN: INTRAVENOUS | Status: DC | PRN

## 2021-06-11 NOTE — Progress Notes (Signed)
Brief note:  S) Syndney sitting up in bed.  Reports she is feeling much better. Tolerating chicken broth. Ambulated to BR without feeling dizzy. Feels the Reglan is really helping.  Mother at the bedside.   O) BP 115/67    Pulse (!) 56    Temp 98 F (36.7 C)    Resp 20    Ht 5\' 4"  (1.626 m)    Wt 63.5 kg    LMP 01/14/2021 (Exact Date)    SpO2 100%    BMI 24.03 kg/m    Intake/Output Summary (Last 24 hours) at 06/11/2021 1438 Last data filed at 06/11/2021 0644 Gross per 24 hour  Intake 1027.19 ml  Output 400 ml  Net 627.19 ml     RN reports blood glucose at 1400 was "around 106"  Gen: Alert, no longer tired looking compared to earlier. Skin: pink   A) Hyperemesis  P) Continue with current plan Advance diet as tolerated  Roberto Scales, CNM  Mosetta Pigeon, [place]  @TODAY @  2:38 PM

## 2021-06-11 NOTE — Progress Notes (Signed)
° °  CHIEF COMPLAINT:   Chief Complaint  Patient presents with   Emesis During Pregnancy    Subjective  Pt reports having a little nausea, it is not as bad as it has been.  Feels like the nausea is related to having just woken up.  Slept well last night.  Denies any pain. Does feel weak, but has not eaten in a while.  Has been up to the use the bathroom with little difficulty, RN stated she was a little light headed when getting up to the BR.    *RN called to say her fasting blood sugar is 178, [name] denies any hx of DM, was screened early  in her last pregnancy a few times but that pregnancy ended at 18 weeks, a diagnosis of GDM was never made.   Objective  Examination:  General exam: Appears calm and comfortable  Respiratory system: Clear to auscultation. Respiratory effort normal.  Cardiovascular system: S1 & S2 heard, RRR. No JVD, murmurs, rubs, gallops or clicks. No pedal edema. Gastrointestinal system: Abdomen is nondistended,  Central nervous system: Alert and oriented. No focal neurological deficits.  Skin: No rashes, lesions or ulcers, Skin pale  Psychiatry: Judgement and insight appear normal. Mood & affect appropriate.   VITALS:  height is 5\' 4"  (1.626 m) and weight is 63.5 kg. Her oral temperature is 98.5 F (36.9 C). Her blood pressure is 110/71 and her pulse is 50 (abnormal). Her respiration is 2 (abnormal) and oxygen saturation is 96%.  Pt is breathing without difficulty with a normal rate above RR recording most likely an error.  I personally reviewed Labs under Results section.  Radiology Reports No results found.     Assessment/Plan:  Hyperemesis   Code Status: full code    Disposition Plan: continue plan placed by Dr but with the following changes Stop D5NS, start LR  Recheck blood sugar in 4 hours Collect HA1C May have icechips and sips of water  DVT Prophylaxis  - SCDs    Tiburcio Pea [facility]

## 2021-06-12 ENCOUNTER — Encounter: Payer: Medicaid Other | Admitting: Advanced Practice Midwife

## 2021-06-12 LAB — GLUCOSE, RANDOM: Glucose, Bld: 116 mg/dL — ABNORMAL HIGH (ref 70–99)

## 2021-06-12 MED ORDER — PROMETHAZINE HCL 12.5 MG RE SUPP: 12.5000 mg | Freq: Four times a day (QID) | RECTAL | 1 refills | Status: DC | PRN

## 2021-06-12 MED ORDER — METOCLOPRAMIDE HCL 10 MG PO TABS: 10.0000 mg | ORAL_TABLET | Freq: Three times a day (TID) | ORAL | 2 refills | Status: DC

## 2021-06-12 MED ORDER — DOXYLAMINE-PYRIDOXINE 10-10 MG PO TBEC: 2.0000 | DELAYED_RELEASE_TABLET | Freq: Every day | ORAL | 0 refills | Status: AC

## 2021-06-12 MED ORDER — ONDANSETRON 4 MG PO TBDP: 4.0000 mg | ORAL_TABLET | Freq: Four times a day (QID) | ORAL | 0 refills | Status: DC | PRN

## 2021-06-12 MED ORDER — METHYLPREDNISOLONE 4 MG PO TABS: ORAL_TABLET | ORAL | 0 refills | Status: AC

## 2021-06-12 NOTE — Discharge Summary (Signed)
Physician Discharge Summary  Patient ID: Sylvia Parks MRN: [id number] DOB/AGE: [birth year] 26 y.o.  Admit date: 06/10/2021 Discharge date: 06/12/2021  Admission Diagnoses: Hyperemesis  Discharge Diagnoses:  Principal Problem:   Nausea & vomiting Active Problems:   Hyperemesis   Discharged Condition: good  Hospital Course: 26 y.o. G2P0010 at.[redacted]w[redacted]d presenting to ED with hyperemesis in the first trimester.  Patient was admitted and started on IV fluids, steroid taper, reglan, and zofran with improvement in symptoms.  She was tolerating po on hospital day 2 and deemed stable for discharge.  She will follow up outpatient for follow up.  Consults: none  Significant Diagnostic Studies:  Recent Results (from the past 2160 hour(s))  Urinalysis, Routine w reflex microscopic Urine, Clean Catch     Status: Abnormal   Collection Time: 05/13/21  1:36 PM  Result Value Ref Range   Color, Urine YELLOW (A) YELLOW   APPearance HAZY (A) CLEAR   Specific Gravity, Urine 1.023 1.005 - 1.030   pH 6.0 5.0 - 8.0   Glucose, UA NEGATIVE NEGATIVE mg/dL   Hgb urine dipstick NEGATIVE NEGATIVE   Bilirubin Urine NEGATIVE NEGATIVE   Ketones, ur NEGATIVE NEGATIVE mg/dL   Protein, ur NEGATIVE NEGATIVE mg/dL   Nitrite NEGATIVE NEGATIVE   Leukocytes,Ua NEGATIVE NEGATIVE    Comment: Performed at [facility], [address] [place], [place] [address]  Urine Culture     Status: Abnormal   Collection Time: 05/13/21  1:36 PM   Specimen: Urine, Random  Result Value Ref Range   Specimen Description      URINE, RANDOM Performed at [facility], [address], [place], [place] [address]    Special Requests      NONE Performed at [facility], [address], [place], [place] [address]    Culture MULTIPLE SPECIES PRESENT, SUGGEST RECOLLECTION (A)    Report Status 05/15/2021 FINAL   POC urine preg, ED     Status: Abnormal   Collection Time: 05/13/21  1:41 PM  Result Value Ref Range    Preg Test, Ur Positive (A) Negative  hCG, quantitative, pregnancy     Status: Abnormal   Collection Time: 05/13/21  3:18 PM  Result Value Ref Range   hCG, Beta Chain, Quant, S 320 (H) <5 mIU/mL    Comment:          GEST. AGE      CONC.  (mIU/mL)   <=1 WEEK        5 - 50     2 WEEKS       50 - 500     3 WEEKS       100 - 10,000     4 WEEKS     1,000 - 30,000     5 WEEKS     3,500 - 115,000   6-8 WEEKS     12,000 - 270,000    12 WEEKS     15,000 - 220,000        FEMALE AND NON-PREGNANT FEMALE:     LESS THAN 5 mIU/mL Performed at [facility], [address], [place], [place] [address]   ABO/Rh     Status: None   Collection Time: 05/13/21  3:27 PM  Result Value Ref Range   ABO/RH(D)      B POS Performed at [facility], [address], [place], [place] [address]   Beta hCG quant (ref lab)     Status: None   Collection  Time: 05/15/21  8:27 AM  Result Value Ref Range   hCG Quant 635 mIU/mL    Comment:                      Female (Non-pregnant)    0 -     5                             (Postmenopausal)  0 -     8                      Female (Pregnant)                      Weeks of Gestation                              3                6 -    71                              4               10 -   750                              5              217 -  7138                              6              158 - 31795                              7             [phone] -[phone]                              8            32065 -[phone]                              3            08657 -[phone]                             10            [phone]                             10            [phone] -[phone]                             14            [phone] - [phone]  15            12039 - 70971                             16             9040 - 56451                             17             8175 - 55868                             18             8099 -  [phone] Roche E CLIA methodology   CBC with Differential     Status: Abnormal   Collection Time: 05/29/21 12:28 PM  Result Value Ref Range   WBC 11.7 (H) 4.0 - 10.5 K/uL   RBC 5.46 (H) 3.87 - 5.11 MIL/uL   Hemoglobin 15.1 (H) 12.0 - 15.0 g/dL   HCT 28.3 15.1 - 76.1 %   MCV 81.3 80.0 - 100.0 fL   MCH 27.7 26.0 - 34.0 pg   MCHC 34.0 30.0 - 36.0 g/dL   RDW 60.7 37.1 - 06.2 %   Platelets 347 150 - 400 K/uL   nRBC 0.0 0.0 - 0.2 %   Neutrophils Relative % 69 %   Neutro Abs 8.0 (H) 1.7 - 7.7 K/uL   Lymphocytes Relative 22 %   Lymphs Abs 2.6 0.7 - 4.0 K/uL   Monocytes Relative 8 %   Monocytes Absolute 0.9 0.1 - 1.0 K/uL   Eosinophils Relative 0 %   Eosinophils Absolute 0.1 0.0 - 0.5 K/uL   Basophils Relative 1 %   Basophils Absolute 0.1 0.0 - 0.1 K/uL   Immature Granulocytes 0 %   Abs Immature Granulocytes 0.03 0.00 - 0.07 K/uL    Comment: Performed at [facility], [address] [place], [place] [address]  Comprehensive metabolic panel     Status: Abnormal   Collection Time: 05/29/21 12:28 PM  Result Value Ref Range   Sodium 134 (L) 135 - 145 mmol/L   Potassium 3.4 (L) 3.5 - 5.1 mmol/L   Chloride 104 98 - 111 mmol/L   CO2 24 22 - 32 mmol/L   Glucose, Bld 98 70 - 99 mg/dL    Comment: Glucose reference range applies only to samples taken after fasting for at least 8 hours.   BUN 11 6 - 20 mg/dL   Creatinine, Ser 4.62 0.44 - 1.00 mg/dL   Calcium 9.1 8.9 - 70.3 mg/dL   Total Protein 7.4 6.5 - 8.1 g/dL   Albumin 4.4 3.5 - 5.0 g/dL   AST 16 15 - 41 U/L   ALT 13 0 - 44 U/L   Alkaline Phosphatase 56 38 - 126 U/L   Total Bilirubin 0.8 0.3 - 1.2 mg/dL   GFR, Estimated >50 >09 mL/min    Comment: (NOTE) Calculated using the CKD-EPI Creatinine Equation (2021)    Anion gap 6 5 - 15    Comment: Performed at [facility], [address] [place], [place] [address]  CBC with Differential     Status: Abnormal   Collection Time: 06/07/21 11:45 AM  Result Value Ref  Range   WBC 16.7 (H) 4.0 -  10.5 K/uL   RBC 5.70 (H) 3.87 - 5.11 MIL/uL   Hemoglobin 15.8 (H) 12.0 - 15.0 g/dL   HCT 32.2 (H) 02.5 - 42.7 %   MCV 80.9 80.0 - 100.0 fL   MCH 27.7 26.0 - 34.0 pg   MCHC 34.3 30.0 - 36.0 g/dL   RDW 06.2 37.6 - 28.3 %   Platelets 315 150 - 400 K/uL   nRBC 0.0 0.0 - 0.2 %   Neutrophils Relative % 84 %   Neutro Abs 14.1 (H) 1.7 - 7.7 K/uL   Lymphocytes Relative 10 %   Lymphs Abs 1.7 0.7 - 4.0 K/uL   Monocytes Relative 5 %   Monocytes Absolute 0.8 0.1 - 1.0 K/uL   Eosinophils Relative 0 %   Eosinophils Absolute 0.0 0.0 - 0.5 K/uL   Basophils Relative 0 %   Basophils Absolute 0.1 0.0 - 0.1 K/uL   Immature Granulocytes 1 %   Abs Immature Granulocytes 0.08 (H) 0.00 - 0.07 K/uL    Comment: Performed at [facility], [address] [place], [place] [address]  Comprehensive metabolic panel     Status: Abnormal   Collection Time: 06/07/21 11:45 AM  Result Value Ref Range   Sodium 134 (L) 135 - 145 mmol/L   Potassium 3.7 3.5 - 5.1 mmol/L   Chloride 98 98 - 111 mmol/L   CO2 23 22 - 32 mmol/L   Glucose, Bld 140 (H) 70 - 99 mg/dL    Comment: Glucose reference range applies only to samples taken after fasting for at least 8 hours.   BUN 13 6 - 20 mg/dL   Creatinine, Ser 1.60 0.44 - 1.00 mg/dL   Calcium 9.5 8.9 - 73.7 mg/dL   Total Protein 7.7 6.5 - 8.1 g/dL   Albumin 4.6 3.5 - 5.0 g/dL   AST 15 15 - 41 U/L   ALT 14 0 - 44 U/L   Alkaline Phosphatase 57 38 - 126 U/L   Total Bilirubin 0.9 0.3 - 1.2 mg/dL   GFR, Estimated >10 >62 mL/min    Comment: (NOTE) Calculated using the CKD-EPI Creatinine Equation (2021)    Anion gap 13 5 - 15    Comment: Performed at [facility], [address] [place], [place] [address]  Urinalysis, Routine w reflex microscopic Urine, Clean Catch     Status: Abnormal   Collection Time: 06/07/21  1:52 PM  Result Value Ref Range   Color, Urine AMBER (A) YELLOW    Comment: BIOCHEMICALS MAY BE AFFECTED BY COLOR    APPearance CLOUDY (A) CLEAR   Specific Gravity, Urine 1.033 (H) 1.005 - 1.030   pH 5.0 5.0 - 8.0   Glucose, UA NEGATIVE NEGATIVE mg/dL   Hgb urine dipstick NEGATIVE NEGATIVE   Bilirubin Urine NEGATIVE NEGATIVE   Ketones, ur 80 (A) NEGATIVE mg/dL   Protein, ur 462 (A) NEGATIVE mg/dL   Nitrite NEGATIVE NEGATIVE   Leukocytes,Ua TRACE (A) NEGATIVE   RBC / HPF 11-20 0 - 5 RBC/hpf   WBC, UA 11-20 0 - 5 WBC/hpf   Bacteria, UA FEW (A) NONE SEEN   Squamous Epithelial / LPF >50 (H) 0 - 5   Mucus PRESENT     Comment: Performed at [facility], [address] [place], [place] [address]  POC urine preg, ED     Status: Abnormal   Collection Time: 06/07/21  1:54 PM  Result Value Ref Range   Preg Test, Ur Positive (A) Negative  Lipase,  blood     Status: None   Collection Time: 06/10/21 10:57 AM  Result Value Ref Range   Lipase 26 11 - 51 U/L    Comment: Performed at [facility], [address] [place], [place] [address]  Comprehensive metabolic panel     Status: Abnormal   Collection Time: 06/10/21 10:57 AM  Result Value Ref Range   Sodium 135 135 - 145 mmol/L   Potassium 3.7 3.5 - 5.1 mmol/L   Chloride 100 98 - 111 mmol/L   CO2 25 22 - 32 mmol/L   Glucose, Bld 124 (H) 70 - 99 mg/dL    Comment: Glucose reference range applies only to samples taken after fasting for at least 8 hours.   BUN 9 6 - 20 mg/dL   Creatinine, Ser 6.04 0.44 - 1.00 mg/dL   Calcium 9.4 8.9 - 54.0 mg/dL   Total Protein 7.9 6.5 - 8.1 g/dL   Albumin 4.4 3.5 - 5.0 g/dL   AST 16 15 - 41 U/L   ALT 15 0 - 44 U/L   Alkaline Phosphatase 56 38 - 126 U/L   Total Bilirubin 1.0 0.3 - 1.2 mg/dL   GFR, Estimated >98 >11 mL/min    Comment: (NOTE) Calculated using the CKD-EPI Creatinine Equation (2021)    Anion gap 10 5 - 15    Comment: Performed at [facility], [address] [place], [place] [address]  CBC     Status: Abnormal   Collection Time: 06/10/21 10:57 AM  Result Value Ref Range    WBC 15.5 (H) 4.0 - 10.5 K/uL   RBC 5.54 (H) 3.87 - 5.11 MIL/uL   Hemoglobin 15.8 (H) 12.0 - 15.0 g/dL   HCT 29.5 62.1 - 30.8 %   MCV 81.9 80.0 - 100.0 fL   MCH 28.5 26.0 - 34.0 pg   MCHC 34.8 30.0 - 36.0 g/dL   RDW 65.7 84.6 - 96.2 %   Platelets 299 150 - 400 K/uL   nRBC 0.0 0.0 - 0.2 %    Comment: Performed at [facility], [address] [place], [place] [address]  hCG, quantitative, pregnancy     Status: Abnormal   Collection Time: 06/10/21 10:57 AM  Result Value Ref Range   hCG, Beta Chain, Quant, S 230,145 (H) <5 mIU/mL    Comment:          GEST. AGE      CONC.  (mIU/mL)   <=1 WEEK        5 - 50     2 WEEKS       50 - 500     3 WEEKS       100 - 10,000     4 WEEKS     1,000 - 30,000     5 WEEKS     3,500 - 115,000   6-8 WEEKS     12,000 - 270,000    12 WEEKS     15,000 - 220,000        FEMALE AND NON-PREGNANT FEMALE:     LESS THAN 5 mIU/mL Performed at [facility], [address] [place], [place] [address]   Hemoglobin A1c     Status: Abnormal   Collection Time: 06/10/21 10:57 AM  Result Value Ref Range   Hgb A1c MFr Bld 4.7 (L) 4.8 - 5.6 %    Comment: (NOTE) Pre diabetes:          5.7%-6.4%  Diabetes:              >  6.4%  Glycemic control for   <7.0% adults with diabetes    Mean Plasma Glucose 88.19 mg/dL    Comment: Performed at [facility], [address] [place], [place] [address]  Resp Panel by RT-PCR (Flu A&B, Covid) Nasopharyngeal Swab     Status: None   Collection Time: 06/10/21  9:25 PM   Specimen: Nasopharyngeal Swab; Nasopharyngeal(NP) swabs in vial transport medium  Result Value Ref Range   SARS Coronavirus 2 by RT PCR NEGATIVE NEGATIVE    Comment: (NOTE) SARS-CoV-2 target nucleic acids are NOT DETECTED.  The SARS-CoV-2 RNA is generally detectable in upper respiratory specimens during the acute phase of infection. The lowest concentration of SARS-CoV-2 viral copies this assay can detect is 138 copies/mL. A negative  result does not preclude SARS-Cov-2 infection and should not be used as the sole basis for treatment or other patient management decisions. A negative result may occur with  improper specimen collection/handling, submission of specimen other than nasopharyngeal swab, presence of viral mutation(s) within the areas targeted by this assay, and inadequate number of viral copies(<138 copies/mL). A negative result must be combined with clinical observations, patient history, and epidemiological information. The expected result is Negative.  Fact Sheet for Patients:  BloggerCourse.comhttps://www.fda.gov/media/152166/download  Fact Sheet for Healthcare Providers:  SeriousBroker.ithttps://www.fda.gov/media/152162/download  This test is no t yet approved or cleared by the [place] FDA and  has been authorized for detection and/or diagnosis of SARS-CoV-2 by FDA under an Emergency Use Authorization (EUA). This EUA will remain  in effect (meaning this test can be used) for the duration of the COVID-19 declaration under Section 564(b)(1) of the Act, 21 U.S.C.section 360bbb-3(b)(1), unless the authorization is terminated  or revoked sooner.       Influenza A by PCR NEGATIVE NEGATIVE   Influenza B by PCR NEGATIVE NEGATIVE    Comment: (NOTE) The Xpert Xpress SARS-CoV-2/FLU/RSV plus assay is intended as an aid in the diagnosis of influenza from Nasopharyngeal swab specimens and should not be used as a sole basis for treatment. Nasal washings and aspirates are unacceptable for Xpert Xpress SARS-CoV-2/FLU/RSV testing.  Fact Sheet for Patients: BloggerCourse.comhttps://www.fda.gov/media/152166/download  Fact Sheet for Healthcare Providers: SeriousBroker.ithttps://www.fda.gov/media/152162/download  This test is not yet approved or cleared by the [place] FDA and has been authorized for detection and/or diagnosis of SARS-CoV-2 by FDA under an Emergency Use Authorization (EUA). This EUA will remain in effect (meaning this test can be used) for the  duration of the COVID-19 declaration under Section 564(b)(1) of the Act, 21 U.S.C. section 360bbb-3(b)(1), unless the authorization is terminated or revoked.  Performed at [facility], [address] [place], [place] [address]   Glucose, fasting - daily while on taper     Status: Abnormal   Collection Time: 06/11/21  6:12 AM  Result Value Ref Range   Glucose, Bld 178 (H) 70 - 99 mg/dL    Comment: Glucose reference range applies only to samples taken after fasting for at least 8 hours. Performed at [facility], [address] [place], [place] [address]   Glucose, capillary     Status: Abnormal   Collection Time: 06/11/21  1:48 PM  Result Value Ref Range   Glucose-Capillary 109 (H) 70 - 99 mg/dL    Comment: Glucose reference range applies only to samples taken after fasting for at least 8 hours.   Comment 1 Notify RN   Glucose, fasting - daily while on taper     Status: Abnormal  Collection Time: 06/12/21  6:38 AM  Result Value Ref Range   Glucose, Bld 116 (H) 70 - 99 mg/dL    Comment: Glucose reference range applies only to samples taken after fasting for at least 8 hours. Performed at [facility], [address] [place], [place] [address]      Treatments: IV hydration  Discharge Exam: Blood pressure 117/71, pulse 75, temperature 98.5 F (36.9 C), temperature source Oral, resp. rate 18, height 5\' 4"  (1.626 m), weight 64 kg, last menstrual period 01/14/2021, SpO2 98 %. General: NAD HEENT: normocephalic  Pulmonary: no increased work of breathing Extremities: no edema Neurologic: normal gait   Disposition: Discharge disposition: 01-Home or Self Care       Discharge Instructions     Discharge activity:  No Restrictions   Complete by: As directed    Discharge diet:  No restrictions   Complete by: As directed    No sexual activity restrictions   Complete by: As directed    Notify physician for a general feeling that "something is  not right"   Complete by: As directed    Notify physician for increase or change in vaginal discharge   Complete by: As directed    Notify physician for intestinal cramps, with or without diarrhea, sometimes described as "gas pain"   Complete by: As directed    Notify physician for leaking of fluid   Complete by: As directed    Notify physician for low, dull backache, unrelieved by heat or Tylenol   Complete by: As directed    Notify physician for menstrual like cramps   Complete by: As directed    Notify physician for pelvic pressure   Complete by: As directed    Notify physician for uterine contractions.  These may be painless and feel like the uterus is tightening or the baby is  "balling up"   Complete by: As directed    Notify physician for vaginal bleeding   Complete by: As directed    PRETERM LABOR:  Includes any of the follwing symptoms that occur between 20 - [redacted] weeks gestation.  If these symptoms are not stopped, preterm labor can result in preterm delivery, placing your baby at risk   Complete by: As directed       Allergies as of 06/12/2021   No Known Allergies      Medication List     STOP taking these medications    metroNIDAZOLE 500 MG tablet Commonly known as: FLAGYL   promethazine 12.5 MG tablet Commonly known as: PHENERGAN Replaced by: promethazine 12.5 MG suppository       TAKE these medications    acetaminophen 500 MG tablet Commonly known as: TYLENOL Take 500 mg by mouth every 6 (six) hours as needed.   Doxylamine-Pyridoxine 10-10 MG Tbec Take 2 tablets by mouth at bedtime.   methylPREDNISolone 4 MG tablet Commonly known as: MEDROL Take 4 tablets (16 mg total) by mouth daily with breakfast for 2 days, THEN 2 tablets (8 mg total) daily with breakfast for 7 days, THEN 1 tablet (4 mg total) daily with breakfast for 2 days. Start taking on: June 13, 2021   metoCLOPramide 10 MG tablet Commonly known as: REGLAN Take 1 tablet (10 mg total) by  mouth 3 (three) times daily before meals.   ondansetron 4 MG disintegrating tablet Commonly known as: ZOFRAN-ODT Take 1 tablet (4 mg total) by mouth every 6 (six) hours as needed for nausea.   promethazine 12.5 MG  suppository Commonly known as: PHENERGAN Place 1 suppository (12.5 mg total) rectally every 6 (six) hours as needed for refractory nausea / vomiting. Replaces: promethazine 12.5 MG tablet        Follow-up Information     [facility] EM[facility].   Specialty: Emergency Medicine Why: If symptoms worsen Contact information: [id number] [place] [address] [phone]        Schedule an appointment as soon as possible for a visit  with [facility]   Contact information: [address] [place] [address] [phone]                 Signed: Vena Austria 06/12/2021, 12:32 PM

## 2021-06-12 NOTE — Progress Notes (Signed)
Patient discharged home via wheelchair in the company of boyfriend.   Discharge instructions, when to follow up, and prescriptions reviewed with patient.  Patient verbalized understanding.

## 2021-06-12 NOTE — Progress Notes (Signed)
Subjective: Patient reports she is feeling remarkedly better this AM. Sitting up eating a regular diet.  No vomiting overnight, and she is voiding often..she would be interested in discharge later today if she can handle regular diet.    Objective: I have reviewed patient's vital signs, intake and output, medications, and labs. BP 123/78 (BP Location: Right Arm)    Pulse (!) 50 Comment: nurse Kierra notified   Temp 98 F (36.7 C) (Oral)    Resp 18    Ht 5\' 4"  (1.626 m)    Wt 64 kg    LMP 01/14/2021 (Exact Date)    SpO2 98%    BMI 24.20 kg/m  Results for orders placed or performed during the hospital encounter of 06/10/21 (from the past 24 hour(s))  Glucose, capillary     Status: Abnormal   Collection Time: 06/11/21  1:48 PM  Result Value Ref Range   Glucose-Capillary 109 (H) 70 - 99 mg/dL   Comment 1 Notify RN   Glucose, fasting - daily while on taper     Status: Abnormal   Collection Time: 06/12/21  6:38 AM  Result Value Ref Range   Glucose, Bld 116 (H) 70 - 99 mg/dL     General: alert, cooperative, and no distress Resp: clear to auscultation bilaterally Cardio: regular rate and rhythm GI: soft, non-tender; bowel sounds normal; no masses,  no organomegaly Extremities: extremities normal, atraumatic, no cyanosis or edema   Assessment/Plan: IUP [redacted] weeks gestation Hyperemesis Gravidarum showing improvement.  Diet advancing to regular this AM Will evaluate this afternoon and consider discharge later today if tolerating diet.  LOS: 2 days    Sylvia Parks 06/12/2021, 9:24 AM

## 2021-06-17 ENCOUNTER — Ambulatory Visit: Payer: Medicaid Other | Admitting: Obstetrics & Gynecology

## 2021-07-08 ENCOUNTER — Telehealth: Payer: Self-pay

## 2021-07-13 ENCOUNTER — Other Ambulatory Visit: Payer: Self-pay

## 2021-07-13 ENCOUNTER — Emergency Department: Payer: Medicaid Other

## 2021-07-13 ENCOUNTER — Emergency Department
Admission: EM | Admit: 2021-07-13 | Discharge: 2021-07-13 | Disposition: A | Discharge status: Home / Self Care | Payer: Medicaid Other | Attending: Emergency Medicine | Admitting: Emergency Medicine

## 2021-07-13 DIAGNOSIS — O99111 Other diseases of the blood and blood-forming organs and certain disorders involving the immune mechanism complicating pregnancy, first trimester: Secondary | ICD-10-CM | POA: Insufficient documentation

## 2021-07-13 DIAGNOSIS — Z3A12 12 weeks gestation of pregnancy: Secondary | ICD-10-CM | POA: Insufficient documentation

## 2021-07-13 DIAGNOSIS — O211 Hyperemesis gravidarum with metabolic disturbance: Secondary | ICD-10-CM | POA: Insufficient documentation

## 2021-07-13 DIAGNOSIS — E860 Dehydration: Secondary | ICD-10-CM

## 2021-07-13 DIAGNOSIS — O26891 Other specified pregnancy related conditions, first trimester: Secondary | ICD-10-CM | POA: Diagnosis not present

## 2021-07-13 DIAGNOSIS — D72829 Elevated white blood cell count, unspecified: Secondary | ICD-10-CM | POA: Diagnosis not present

## 2021-07-13 DIAGNOSIS — R112 Nausea with vomiting, unspecified: Secondary | ICD-10-CM

## 2021-07-13 DIAGNOSIS — M545 Low back pain, unspecified: Secondary | ICD-10-CM | POA: Diagnosis not present

## 2021-07-13 DIAGNOSIS — R824 Acetonuria: Secondary | ICD-10-CM | POA: Diagnosis not present

## 2021-07-13 DIAGNOSIS — O0281 Inappropriate change in quantitative human chorionic gonadotropin (hCG) in early pregnancy: Secondary | ICD-10-CM | POA: Insufficient documentation

## 2021-07-13 LAB — URINALYSIS, ROUTINE W REFLEX MICROSCOPIC
Glucose, UA: NEGATIVE mg/dL
Ketones, ur: 40 mg/dL — AB
Nitrite: NEGATIVE
Protein, ur: 100 mg/dL — AB
Specific Gravity, Urine: >1.03 — ABNORMAL HIGH (ref 1.005–1.030)
Squamous Epithelial / HPF: >50 — ABNORMAL HIGH (ref 0–5)
WBC, UA: >50 WBC/hpf — ABNORMAL HIGH (ref 0–5)
pH: 5.5 (ref 5.0–8.0)

## 2021-07-13 LAB — CBC WITH DIFFERENTIAL/PLATELET
Abs Immature Granulocytes: 0.04 10*3/uL (ref 0.00–0.07)
Basophils Absolute: 0 10*3/uL (ref 0.0–0.1)
Basophils Relative: 0 %
Eosinophils Absolute: 0 10*3/uL (ref 0.0–0.5)
Eosinophils Relative: 0 %
HCT: 41.3 % (ref 36.0–46.0)
Hemoglobin: 14.2 g/dL (ref 12.0–15.0)
Immature Granulocytes: 0 %
Lymphocytes Relative: 16 %
Lymphs Abs: 1.7 10*3/uL (ref 0.7–4.0)
MCH: 28.6 pg (ref 26.0–34.0)
MCHC: 34.4 g/dL (ref 30.0–36.0)
MCV: 83.1 fL (ref 80.0–100.0)
Monocytes Absolute: 0.6 10*3/uL (ref 0.1–1.0)
Monocytes Relative: 6 %
Neutro Abs: 8.3 10*3/uL — ABNORMAL HIGH (ref 1.7–7.7)
Neutrophils Relative %: 78 %
Platelets: 266 10*3/uL (ref 150–400)
RBC: 4.97 MIL/uL (ref 3.87–5.11)
RDW: 13.1 % (ref 11.5–15.5)
WBC: 10.6 10*3/uL — ABNORMAL HIGH (ref 4.0–10.5)
nRBC: 0 % (ref 0.0–0.2)

## 2021-07-13 LAB — COMPREHENSIVE METABOLIC PANEL
ALT: 12 U/L (ref 0–44)
AST: 13 U/L — ABNORMAL LOW (ref 15–41)
Albumin: 3.6 g/dL (ref 3.5–5.0)
Alkaline Phosphatase: 49 U/L (ref 38–126)
Anion gap: 8 (ref 5–15)
BUN: 6 mg/dL (ref 6–20)
CO2: 25 mmol/L (ref 22–32)
Calcium: 9.2 mg/dL (ref 8.9–10.3)
Chloride: 102 mmol/L (ref 98–111)
Creatinine, Ser: 0.65 mg/dL (ref 0.44–1.00)
GFR, Estimated: >60 mL/min (ref >60)
Glucose, Bld: 115 mg/dL — ABNORMAL HIGH (ref 70–99)
Potassium: 3.4 mmol/L — ABNORMAL LOW (ref 3.5–5.1)
Sodium: 135 mmol/L (ref 135–145)
Total Bilirubin: 0.5 mg/dL (ref 0.3–1.2)
Total Protein: 7.1 g/dL (ref 6.5–8.1)

## 2021-07-13 LAB — HCG, QUANTITATIVE, PREGNANCY: hCG, Beta Chain, Quant, S: 62915 m[IU]/mL — ABNORMAL HIGH (ref <5)

## 2021-07-13 MED ORDER — DOXYLAMINE-PYRIDOXINE 10-10 MG PO TBEC: 10.0000 mg | DELAYED_RELEASE_TABLET | Freq: Three times a day (TID) | ORAL | 0 refills | Status: DC

## 2021-07-13 MED ORDER — DEXTROSE 5 % IN LACTATED RINGERS IV BOLUS
1000.0000 mL | Freq: Once | INTRAVENOUS | Status: AC
  Administered 2021-07-13: 1000 mL via INTRAVENOUS
  Filled 2021-07-13: qty 1000

## 2021-07-13 MED ORDER — ACETAMINOPHEN 500 MG PO TABS
1000.0000 mg | ORAL_TABLET | Freq: Once | ORAL | Status: AC
  Administered 2021-07-13: 1000 mg via ORAL
  Filled 2021-07-13: qty 2

## 2021-07-13 MED ORDER — METOCLOPRAMIDE HCL 5 MG/ML IJ SOLN
10.0000 mg | Freq: Once | INTRAMUSCULAR | Status: AC
  Administered 2021-07-13: 10 mg via INTRAVENOUS
  Filled 2021-07-13: qty 2

## 2021-07-13 MED ORDER — METOCLOPRAMIDE HCL 10 MG PO TABS: 10.0000 mg | ORAL_TABLET | Freq: Three times a day (TID) | ORAL | 1 refills | Status: DC

## 2021-07-13 MED ORDER — LIDOCAINE 5 % EX PTCH
1.0000 | MEDICATED_PATCH | CUTANEOUS | Status: DC
  Administered 2021-07-13: 1 via TRANSDERMAL
  Filled 2021-07-13: qty 1

## 2021-07-13 MED ORDER — DEXTROSE 5 % IN LACTATED RINGERS IV BOLUS
2000.0000 mL | Freq: Once | INTRAVENOUS | Status: AC
  Administered 2021-07-13: 2000 mL via INTRAVENOUS
  Filled 2021-07-13 (×2): qty 2000

## 2021-07-13 NOTE — ED Triage Notes (Signed)
Pt presents to ER c/o severe lower back pain for around a day and a half with some associated lower abd pain.  Pt states she is [redacted] weeks pregnant G2P1 and has been hospitalized during this pregnancy for HG.  Pt denies vaginal bleeding.  Pt states she has been unable to keep food/drink down for last 5 days as well.  Pt A&O x4 at this time.

## 2021-07-13 NOTE — ED Provider Notes (Signed)
[facility] Provider Note    Event Date/Time   First MD Initiated Contact with Patient 07/13/21 1955     (approximate)   History   Back Pain   HPI  Sylvia Parks is a 26 y.o. female who presents to the ED for evaluation of Back Pain   I reviewed DC summary from 1/5.  Patient is a G2 P1 now at about [redacted] weeks gestation.  She was admitted then for hyperemesis with associated dehydration. Rh+ on chart review.  Patient presents to the ED for evaluation of 5 days of unable to keep anything down and recurrent emesis.  Reports insurance difficulties such that she has been unable to acquire her Diclegis and Reglan.  Over the past 1.5 days, she has had increasing atraumatic right-sided paraspinal lumbar back pain.  Denies any vaginal bleeding, dysuria, fever, falls or syncope or abdominal trauma.  Reports explicit concern for dehydration, feeling similar as when she was admitted for hyperemesis last month.  Physical Exam   Triage Vital Signs: ED Triage Vitals  Enc Vitals Group     BP 07/13/21 1952 135/81     Pulse Rate 07/13/21 1952 (!) 104     Resp 07/13/21 1952 20     Temp 07/13/21 1952 98.2 F (36.8 C)     Temp Source 07/13/21 1952 Oral     SpO2 07/13/21 1952 94 %     Weight 07/13/21 1953 148 lb (67.1 kg)     Height 07/13/21 1953 5\' 4"  (1.626 m)     Head Circumference --      Peak Flow --      Pain Score 07/13/21 1953 7     Pain Loc --      Pain Edu? --      Excl. in GC? --     Most recent vital signs: Vitals:   07/13/21 1952  BP: 135/81  Pulse: (!) 104  Resp: 20  Temp: 98.2 F (36.8 C)  SpO2: 94%    General: Awake, no distress.  Appears uncomfortable. CV:  Good peripheral perfusion.  Tachycardic and regular Resp:  Normal effort.  CTA B Abd:  No distention.  Minimal lower abdominal tenderness without peritoneal features.  Upper abdomen is benign. MSK:  No deformity noted.  Right-sided paraspinal lumbar tenderness to palpation without  overlying skin changes or signs of trauma.  No midline spinal tenderness. Neuro:  No focal deficits appreciated. Other:     ED Results / Procedures / Treatments   Labs (all labs ordered are listed, but only abnormal results are displayed) Labs Reviewed  CBC WITH DIFFERENTIAL/PLATELET - Abnormal; Notable for the following components:      Result Value   WBC 10.6 (*)    Neutro Abs 8.3 (*)    All other components within normal limits  COMPREHENSIVE METABOLIC PANEL - Abnormal; Notable for the following components:   Potassium 3.4 (*)    Glucose, Bld 115 (*)    AST 13 (*)    All other components within normal limits  URINALYSIS, ROUTINE W REFLEX MICROSCOPIC - Abnormal; Notable for the following components:   Color, Urine YELLOW (*)    APPearance CLEAR (*)    Specific Gravity, Urine >1.030 (*)    Hgb urine dipstick TRACE (*)    Bilirubin Urine SMALL (*)    Ketones, ur 40 (*)    Protein, ur 100 (*)    Leukocytes,Ua TRACE (*)    WBC, UA >50 (*)  Bacteria, UA RARE (*)    Squamous Epithelial / LPF >50 (*)    All other components within normal limits  HCG, QUANTITATIVE, PREGNANCY - Abnormal; Notable for the following components:   hCG, Beta Chain, Quant, S 51,761 (*)    All other components within normal limits    EKG   RADIOLOGY Pelvic ultrasound reviewed by me with IUP and appropriate fetal heart rate  Official radiology report(s): US OB LESS THAN 14 WEEKS WITH OB TRANSVAGINAL  Result Date: 07/13/2021 CLINICAL DATA:  Nausea, vomiting EXAM: OBSTETRIC <14 WK [place] AND TRANSVAGINAL OB US TECHNIQUE: Both transabdominal and transvaginal ultrasound examinations were performed for complete evaluation of the gestation as well as the maternal uterus, adnexal regions, and pelvic cul-de-sac. Transvaginal technique was performed to assess early pregnancy. COMPARISON:  None. FINDINGS: Intrauterine gestational sac: Single Yolk sac:  Not Visualized. Embryo:  Visualized. Cardiac Activity:  Visualized. Heart Rate: 176 bpm MSD:   mm    w     d CRL:  63 mm   12 w   5 d                  [place] EDC: 01/20/2022 Subchorionic hemorrhage:  None visualized. Maternal uterus/adnexae: No adnexal mass or free fluid. IMPRESSION: Twelve week 5 day intrauterine pregnancy. Fetal heart rate 176 beats per minute. No acute maternal findings. Electronically Signed   By: Charlett Nose M.D.   On: 07/13/2021 21:15    PROCEDURES and INTERVENTIONS:  .1-3 Lead EKG Interpretation Performed by: Delton Prairie, MD Authorized by: Delton Prairie, MD     Interpretation: normal     ECG rate:  90   ECG rate assessment: normal     Rhythm: sinus rhythm     Ectopy: none     Conduction: normal    Medications  lidocaine (LIDODERM) 5 % 1 patch (1 patch Transdermal Patch Applied 07/13/21 2048)  dextrose 5% lactated ringers bolus 2,000 mL (2,000 mLs Intravenous New Bag/Given 07/13/21 2034)  acetaminophen (TYLENOL) tablet 1,000 mg (1,000 mg Oral Given 07/13/21 2049)  dextrose 5% lactated ringers bolus 1,000 mL (1,000 mLs Intravenous New Bag/Given 07/13/21 2214)  metoCLOPramide (REGLAN) injection 10 mg (10 mg Intravenous Given 07/13/21 2203)     IMPRESSION / MDM / ASSESSMENT AND PLAN / ED COURSE  I reviewed the triage vital signs and the nursing notes.  26 year old female at [redacted] weeks gestation presents to the ED with evidence of recurrent hyperemesis gravidarum causing dehydration, ultimately suitable for outpatient management after IV rehydration in the ED.  She is tachycardic and appears uncomfortable on arrival.  Minimal abdominal tenderness without peritoneal features or guarding.  Her back pain is possibly due to her retching or electrolyte disturbances, and I suspect MSK in etiology.  Blood work with marginal leukocytosis and hypokalemia, largely unremarkable basic labs.  hCG is appropriately elevated.  Urine with ketonuria and dirty with many squamous cells.  We will send for culture and abstain from antibiotics at this time, less  likely to be acute cystitis considering her lack of urinary symptoms.  After extensive rehydration with 3 L D5LR and a dose of Reglan, she is feeling much better and tolerating p.o. intake.  She reports that she is able to pick up her antiemetics tomorrow at the pharmacy.  We will have her follow-up closely with her GYN and we discussed return precautions for the ED.  Clinical Course as of 07/13/21 2310  Wynelle Link Jul 13, 2021  2021 rh positive [DS]  2142 Reassessed.  Feeling a little bit better.  We discussed signs of dehydration and reassuring ultrasound. [DS]  2307 Reassessed.  Feeling a lot better.  We discussed management at home and return precautions for the ED. [DS]    Clinical Course User Index [DS] Delton Prairie, MD     FINAL CLINICAL IMPRESSION(S) / ED DIAGNOSES   Final diagnoses:  Nausea & vomiting  Hyperemesis gravidarum with dehydration  Dehydration     Rx / DC Orders   ED Discharge Orders     None        Note:  This document was prepared using Dragon voice recognition software and may include unintentional dictation errors.   Delton Prairie, MD 07/13/21 [phone]

## 2021-07-13 NOTE — Discharge Instructions (Addendum)
Use Tylenol for pain and fevers.  Up to 1000 mg per dose, up to 4 times per day.  Do not take more than 4000 mg of Tylenol/acetaminophen within 24 hours..  

## 2021-07-13 NOTE — ED Notes (Signed)
Charge nurse as well as Katrinka Blazing, MD at bedside attempting to get fetal heart tones. Unable to obtain, ultrasound ordered.

## 2021-07-15 LAB — URINE CULTURE

## 2021-07-15 NOTE — Telephone Encounter (Signed)
Pt is scheduled on 2/13 with LMD.

## 2021-07-21 ENCOUNTER — Other Ambulatory Visit (HOSPITAL_COMMUNITY)
Admission: RE | Admit: 2021-07-21 | Discharge: 2021-07-21 | Disposition: A | Discharge status: Home / Self Care | Payer: Medicaid Other | Source: Ambulatory Visit | Attending: Licensed Practical Nurse | Admitting: Licensed Practical Nurse

## 2021-07-21 ENCOUNTER — Ambulatory Visit (INDEPENDENT_AMBULATORY_CARE_PROVIDER_SITE_OTHER): Payer: Medicaid Other | Admitting: Licensed Practical Nurse

## 2021-07-21 ENCOUNTER — Other Ambulatory Visit: Payer: Self-pay

## 2021-07-21 VITALS — BP 128/80 | Wt 150.0 lb

## 2021-07-21 DIAGNOSIS — O09291 Supervision of pregnancy with other poor reproductive or obstetric history, first trimester: Secondary | ICD-10-CM | POA: Diagnosis not present

## 2021-07-21 DIAGNOSIS — O0990 Supervision of high risk pregnancy, unspecified, unspecified trimester: Secondary | ICD-10-CM | POA: Diagnosis not present

## 2021-07-21 DIAGNOSIS — Z1379 Encounter for other screening for genetic and chromosomal anomalies: Secondary | ICD-10-CM

## 2021-07-21 DIAGNOSIS — Z113 Encounter for screening for infections with a predominantly sexual mode of transmission: Secondary | ICD-10-CM | POA: Diagnosis present

## 2021-07-21 DIAGNOSIS — Z3A13 13 weeks gestation of pregnancy: Secondary | ICD-10-CM

## 2021-07-21 DIAGNOSIS — Z3480 Encounter for supervision of other normal pregnancy, unspecified trimester: Secondary | ICD-10-CM | POA: Diagnosis not present

## 2021-07-21 LAB — POCT URINALYSIS DIPSTICK OB
Glucose, UA: NEGATIVE
POC,PROTEIN,UA: NEGATIVE

## 2021-07-22 LAB — CBC/D/PLT+RPR+RH+ABO+RUBIGG...
Antibody Screen: NEGATIVE
Basophils Absolute: 0.1 10*3/uL (ref 0.0–0.2)
Basos: 1 %
EOS (ABSOLUTE): 0.1 10*3/uL (ref 0.0–0.4)
Eos: 1 %
HCV Ab: NONREACTIVE
HIV Screen 4th Generation wRfx: NONREACTIVE
Hematocrit: 39.4 % (ref 34.0–46.6)
Hemoglobin: 13.5 g/dL (ref 11.1–15.9)
Hepatitis B Surface Ag: NEGATIVE
Immature Grans (Abs): 0 10*3/uL (ref 0.0–0.1)
Immature Granulocytes: 0 %
Lymphocytes Absolute: 1.8 10*3/uL (ref 0.7–3.1)
Lymphs: 17 %
MCH: 28.4 pg (ref 26.6–33.0)
MCHC: 34.3 g/dL (ref 31.5–35.7)
MCV: 83 fL (ref 79–97)
Monocytes Absolute: 0.7 10*3/uL (ref 0.1–0.9)
Monocytes: 7 %
Neutrophils Absolute: 7.9 10*3/uL — ABNORMAL HIGH (ref 1.4–7.0)
Neutrophils: 74 %
Platelets: 240 10*3/uL (ref 150–450)
RBC: 4.75 x10E6/uL (ref 3.77–5.28)
RDW: 13.1 % (ref 11.7–15.4)
RPR Ser Ql: NONREACTIVE
Rh Factor: POSITIVE
Rubella Antibodies, IGG: 2.65 index (ref >0.99)
Varicella zoster IgG: 212 index (ref >165)
WBC: 10.5 10*3/uL (ref 3.4–10.8)

## 2021-07-22 LAB — URINE DRUG PANEL 7
Amphetamines, Urine: NEGATIVE ng/mL
Barbiturate Quant, Ur: NEGATIVE ng/mL
Benzodiazepine Quant, Ur: NEGATIVE ng/mL
Cannabinoid Quant, Ur: NEGATIVE ng/mL
Cocaine (Metab.): NEGATIVE ng/mL
Opiate Quant, Ur: NEGATIVE ng/mL
PCP Quant, Ur: NEGATIVE ng/mL

## 2021-07-22 LAB — HCV INTERPRETATION

## 2021-07-22 NOTE — Progress Notes (Signed)
New Obstetric Patient H&P    Chief Complaint: "Desires prenatal care" Has been seen by [facility] while inpatient for hyperemesis, now here to establish care.  Has not yet been seen for prenatal care d/t frequent hospitalizations. Last pregnancy was with [facility] midwives,  she had a loss at 18 wks d/t PPROM-prefers to have care with physician involvement this time around.   Reports her hyperemesis is under control with the medication prescribed.  History of Present Illness: Patient is a 26 y.o. G2P0010 Not Hispanic or Latino female, presents with amenorrhea and positive home pregnancy test. Patient's last menstrual period was 01/14/2021 (exact date). and based on her  [place] at 5wks, her EDD is Estimated Date of Delivery: 01/22/22 and her EGA is [redacted]w[redacted]d.  Her last pap smear was 3 years ago and was no abnormalities. Declines Pap and pelvic exam today    This is a new partner.  Since her LMP she claims she has experienced hyperemesis, has been hospitalized twice. She denies vaginal bleeding. Her past medical history is noncontributory. Her prior pregnancies are notable for  PPROM at 18 wks, followed by PTB  Since her LMP, she admits to the use of tobacco products  no She claims she has lost weight There are cats in the home in the home  no  She admits close contact with children on a regular basis  no  She has had chicken pox in the past unknown She has had Tuberculosis exposures, symptoms, or previously tested positive for TB   no Current or past history of domestic violence. no  Genetic Screening/Teratology Counseling: (Includes patient, baby's father, or anyone in either family with:)  [name] and [place] at caucasian   1. Patient's age >/= 61 at [facility]  no 2. Thalassemia ([place], [place], Mediterranean, or Asian background): MCV<80  no 3. Neural tube defect (meningomyelocele, spina bifida, anencephaly)  no 4. Congenital heart defect  no  5. Down syndrome  no 6. Tay-Sachs (Jewish, [place]   no 7. Canavan's Disease  no 8. Sickle cell disease or trait (African)  no  9. Hemophilia or other blood disorders  no  10. Muscular dystrophy  no  11. Cystic fibrosis  no  12. Huntington's Chorea  no  13. Mental retardation/autism  no 14. Other inherited genetic or chromosomal disorder  no 15. Maternal metabolic disorder (DM, PKU, etc)  no 16. Patient or FOB with a child with a birth defect not listed above no  16a. Patient or FOB with a birth defect themselves no 17. Recurrent pregnancy loss, or stillbirth  no  18. Any medications since LMP other than prenatal vitamins (include vitamins, supplements, OTC meds, drugs, alcohol)  no 19. Any other genetic/environmental exposure to discuss  no  Infection History:   1. Lives with someone with TB or TB exposed  no  2. Patient or partner has history of genital herpes  no 3. Rash or viral illness since LMP  no 4. History of STI (GC, CT, HPV, syphilis, HIV)  no 5. History of recent travel :  no  Other pertinent information:  yes  Lives with [place], feels safe Likes to go hunting for exercise, no longer working d/t the hyperemesis.    Review of Systems:10 point review of systems negative unless otherwise noted in HPI  Past Medical History:  Patient Active Problem List   Diagnosis Date Noted   Nausea & vomiting 06/10/2021   Hyperemesis 06/10/2021    Past Surgical History:  Past Surgical History:  Procedure Laterality Date   ADENOIDECTOMY      Gynecologic History: Patient's last menstrual period was 01/14/2021 (exact date).  Obstetric History: G2P0010  Family History:  No family history on file.  Social History:  Social History   Socioeconomic History   Marital status: Single    Spouse name: Not on file   Number of children: Not on file   Years of education: Not on file   Highest education level: Not on file  Occupational History   Not on file  Tobacco Use   Smoking status: Former   Smokeless tobacco: Never  Vaping  Use   Vaping Use: Former   Quit date: 05/01/2021  Substance and Sexual Activity   Alcohol use: No   Drug use: Never   Sexual activity: Yes    Partners: Male  Other Topics Concern   Not on file  Social History Narrative   Not on file   Social Determinants of Health   Financial Resource Strain: Not on file  Food Insecurity: Not on file  Transportation Needs: Not on file  Physical Activity: Not on file  Stress: Not on file  Social Connections: Not on file  Intimate Partner Violence: Not on file    Allergies:  No Known Allergies  Medications: Prior to Admission medications   Medication Sig Start Date End Date Taking? Authorizing Provider  acetaminophen (TYLENOL) 500 MG tablet Take 500 mg by mouth every 6 (six) hours as needed.   Yes [provider]  Doxylamine-Pyridoxine (DICLEGIS) 10-10 MG TBEC Take 10 mg by mouth in the morning, at noon, and at bedtime. 07/13/21  Yes Delton Prairie, MD  metoCLOPramide (REGLAN) 10 MG tablet Take 1 tablet (10 mg total) by mouth 3 (three) times daily before meals. 06/12/21 09/10/21 Yes Vena [place], MD  metoCLOPramide (REGLAN) 10 MG tablet Take 1 tablet (10 mg total) by mouth 3 (three) times daily with meals. 07/13/21 07/13/22  Delton Prairie, MD  ondansetron (ZOFRAN-ODT) 4 MG disintegrating tablet Take 1 tablet (4 mg total) by mouth every 6 (six) hours as needed for nausea. Patient not taking: Reported on 07/21/2021 06/12/21   Vena [place], MD  promethazine (PHENERGAN) 12.5 MG suppository Place 1 suppository (12.5 mg total) rectally every 6 (six) hours as needed for refractory nausea / vomiting. Patient not taking: Reported on 07/21/2021 06/12/21   Vena [place], MD    Physical Exam Vitals: Blood pressure 128/80, weight 150 lb (68 kg), last menstrual period 01/14/2021.  General: NAD HEENT: normocephalic, anicteric Thyroid: no enlargement, no palpable nodules Pulmonary: No increased work of breathing, CTAB Cardiovascular: RRR, distal  pulses 2+ Abdomen: NABS, soft, non-tender, non-distended.  Umbilicus without lesions.  No hepatomegaly, splenomegaly or masses palpable. No evidence of hernia  Genitourinary:  External: Normal external female genitalia.  Normal urethral meatus, normal  Bartholin's and Skene's glands.    Pelvic not assessed  Rectal: deferred Extremities: no edema, erythema, or tenderness Neurologic: Grossly intact Psychiatric: mood appropriate, affect full   Assessment: 26 y.o. G2P0010 at [redacted]w[redacted]d presenting to initiate prenatal care  Plan: 1) Avoid alcoholic beverages. 2) Patient encouraged not to smoke.  3) Discontinue the use of all non-medicinal drugs and chemicals.  4) Take prenatal vitamins daily.  5) Nutrition, food safety (fish, cheese advisories, and high nitrite foods) and exercise discussed. 6) Hospital and practice style discussed with cross coverage system.  7) Genetic Screening, such as with 1st Trimester Screening, cell free fetal DNA, AFP testing, and Ultrasound, as well as with amniocentesis and  [facility] as appropriate, is discussed with patient. At the conclusion of today's visit patient requested genetic testing Had carrier screening with last pregnancy.  8) Patient is asked about travel to areas at risk for the Zika virus, and counseled to avoid travel and exposure to mosquitoes or sexual partners who may have themselves been exposed to the virus. Testing is discussed, and will be ordered as appropriate.  9) NOB labs collected  10) [place] for cervical length ordered, will need cervical exam or DL every 2 weeks.   Carie Caddy, CNM  [facility], [facility] 07/22/2021, 11:31 AM

## 2021-07-23 ENCOUNTER — Telehealth: Payer: Self-pay | Admitting: Licensed Practical Nurse

## 2021-07-23 ENCOUNTER — Other Ambulatory Visit: Payer: Self-pay | Admitting: Licensed Practical Nurse

## 2021-07-23 DIAGNOSIS — Z3482 Encounter for supervision of other normal pregnancy, second trimester: Secondary | ICD-10-CM

## 2021-07-23 DIAGNOSIS — Z8751 Personal history of pre-term labor: Secondary | ICD-10-CM

## 2021-07-23 LAB — CERVICOVAGINAL ANCILLARY ONLY
Chlamydia: NEGATIVE
Comment: NEGATIVE
Comment: NORMAL
Neisseria Gonorrhea: NEGATIVE

## 2021-07-23 LAB — URINE CULTURE

## 2021-07-23 NOTE — Telephone Encounter (Signed)
Hello PT called because she had a missed call from you. And just was calling back didn't know if you was calling about the scan you was talking to her about on Monday the one she needed.. Pt would like for you to call her back.Thank you

## 2021-07-23 NOTE — Telephone Encounter (Signed)
It's all done it was just a reminder call about her appt for [place] tomorrow. Thanks

## 2021-07-24 ENCOUNTER — Other Ambulatory Visit: Payer: Self-pay

## 2021-07-24 ENCOUNTER — Telehealth: Payer: Self-pay

## 2021-07-24 ENCOUNTER — Ambulatory Visit (INDEPENDENT_AMBULATORY_CARE_PROVIDER_SITE_OTHER): Payer: Medicaid Other

## 2021-07-24 ENCOUNTER — Other Ambulatory Visit: Payer: Self-pay | Admitting: Obstetrics

## 2021-07-24 DIAGNOSIS — Z3A13 13 weeks gestation of pregnancy: Secondary | ICD-10-CM

## 2021-07-24 DIAGNOSIS — O0990 Supervision of high risk pregnancy, unspecified, unspecified trimester: Secondary | ICD-10-CM | POA: Diagnosis not present

## 2021-07-24 DIAGNOSIS — Z3480 Encounter for supervision of other normal pregnancy, unspecified trimester: Secondary | ICD-10-CM

## 2021-07-24 MED ORDER — DOXYLAMINE-PYRIDOXINE 10-10 MG PO TBEC: 10.0000 mg | DELAYED_RELEASE_TABLET | Freq: Three times a day (TID) | ORAL | 0 refills | Status: DC

## 2021-07-24 NOTE — Telephone Encounter (Signed)
Patient is requesting a refill on her Doxylamine-Pyridoxine (DICLEGIS) 10-10 MG TBEC. Please advise. Patient didn't schedule return visit. There was no return note/ noted. Please advise !

## 2021-07-24 NOTE — Telephone Encounter (Signed)
done

## 2021-07-24 NOTE — Telephone Encounter (Signed)
Patient is aware 

## 2021-07-25 ENCOUNTER — Telehealth: Payer: Self-pay

## 2021-07-25 LAB — MATERNIT 21 PLUS CORE, BLOOD
Fetal Fraction: 11
Result (T21): NEGATIVE
Trisomy 13 (Patau syndrome): NEGATIVE
Trisomy 18 (Edwards syndrome): NEGATIVE
Trisomy 21 (Down syndrome): NEGATIVE

## 2021-07-25 NOTE — Telephone Encounter (Signed)
Spoke w/patient. Notified of appointment time/location. She will keep appointment.

## 2021-07-28 ENCOUNTER — Other Ambulatory Visit: Payer: Self-pay | Admitting: Obstetrics

## 2021-07-28 ENCOUNTER — Ambulatory Visit (HOSPITAL_BASED_OUTPATIENT_CLINIC_OR_DEPARTMENT_OTHER): Payer: Medicaid Other | Admitting: Obstetrics

## 2021-07-28 ENCOUNTER — Ambulatory Visit: Payer: Medicaid Other | Attending: Obstetrics

## 2021-07-28 ENCOUNTER — Ambulatory Visit: Payer: Medicaid Other | Admitting: *Deleted

## 2021-07-28 ENCOUNTER — Other Ambulatory Visit: Payer: Self-pay | Admitting: *Deleted

## 2021-07-28 ENCOUNTER — Other Ambulatory Visit: Payer: Self-pay

## 2021-07-28 VITALS — BP 132/81 | HR 95

## 2021-07-28 DIAGNOSIS — O09292 Supervision of pregnancy with other poor reproductive or obstetric history, second trimester: Secondary | ICD-10-CM

## 2021-07-28 DIAGNOSIS — Z363 Encounter for antenatal screening for malformations: Secondary | ICD-10-CM | POA: Insufficient documentation

## 2021-07-28 DIAGNOSIS — Z3A14 14 weeks gestation of pregnancy: Secondary | ICD-10-CM

## 2021-07-28 DIAGNOSIS — Z3480 Encounter for supervision of other normal pregnancy, unspecified trimester: Secondary | ICD-10-CM

## 2021-07-28 DIAGNOSIS — O26872 Cervical shortening, second trimester: Secondary | ICD-10-CM | POA: Insufficient documentation

## 2021-07-28 DIAGNOSIS — O321XX0 Maternal care for breech presentation, not applicable or unspecified: Secondary | ICD-10-CM | POA: Diagnosis not present

## 2021-07-28 NOTE — Progress Notes (Signed)
MFM Note  Sylvia Parks was seen due to a history of a prior previable birth at 49 weeks following PPROM in 2021. The patient reports that in her prior pregnancy, she experienced vaginal bleeding throughout the pregnancy and a subchorionic hemorrhage was noted on ultrasound.  She ruptured membranes at 18 weeks and subsequently delivered.  Due to her past history, a transvaginal ultrasound performed in your office last week showed a cervical length of 3.78 cm without any signs of funneling.  However, the cervical canal appeared to be open for 2 to 3 mm for its entire length.  She denies any significant past medical history and denies any problems in her current pregnancy.  She denies any vaginal bleeding in her current pregnancy.  She had a cell free DNA test earlier in her pregnancy which indicated a low risk for trisomy 4, 41, and 13. A female fetus is predicted.   She was informed that the fetal growth and amniotic fluid level were appropriate for her gestational age.  The fetal biometry measurements obtained today confirms an [facility] of January 22, 2022.  The views of the fetal anatomy were not assessed today.  On a transvaginal ultrasound performed today, her cervical length measured 4.0 cm long without any signs of funneling.  There was a small amount of fluid noted within the cervical canal.  She was reassured that this is most likely a normal finding.  The patient and her mother were advised that the cause of PPROM and previable birth in her last pregnancy was most likely due to her persistent vaginal bleeding and the subchorionic hematoma which probably weakened the gestational sac and ultimately caused her to rupture membranes.  The fact that she has not experienced any vaginal bleeding and there are no signs of a subchorionic hematoma noted in her current pregnancy, along with her normal cervical length measurement, probably indicates that her overall risk of another preterm birth is  low.  Management options for women who have a prior previable birth including weekly progesterone (17-P) injections, a cervical cerclage stitch, and serial measurements of transvaginal cervical length were discussed.    As I do not believe that she has an incompetent cervix, she probably will not need a cerclage unless we note progressive cervical shortening during her future exams.  As her last pregnancy was probably not due to spontaneous preterm labor, I do not believe that weekly progesterone injections would decrease her risk of another preterm birth.  We will continue to follow her with transvaginal cervical length measurements once every 2 weeks.  The patient is happy and comfortable with this management plan.    We will schedule her future appointments at [facility].    The patient stated that all of her questions were answered today.  A total of 30 minutes was spent counseling and coordinating the care for this patient.  Greater than 50% of the time was spent in direct face-to-face contact.

## 2021-07-30 ENCOUNTER — Ambulatory Visit (INDEPENDENT_AMBULATORY_CARE_PROVIDER_SITE_OTHER): Payer: Medicaid Other | Admitting: Family Medicine

## 2021-07-30 ENCOUNTER — Other Ambulatory Visit: Payer: Self-pay

## 2021-07-30 VITALS — BP 120/72 | Wt 153.0 lb

## 2021-07-30 DIAGNOSIS — O09292 Supervision of pregnancy with other poor reproductive or obstetric history, second trimester: Secondary | ICD-10-CM | POA: Insufficient documentation

## 2021-07-30 DIAGNOSIS — R1110 Vomiting, unspecified: Secondary | ICD-10-CM

## 2021-07-30 DIAGNOSIS — O0990 Supervision of high risk pregnancy, unspecified, unspecified trimester: Secondary | ICD-10-CM | POA: Insufficient documentation

## 2021-07-30 DIAGNOSIS — Z8759 Personal history of other complications of pregnancy, childbirth and the puerperium: Secondary | ICD-10-CM | POA: Insufficient documentation

## 2021-07-30 DIAGNOSIS — Z3480 Encounter for supervision of other normal pregnancy, unspecified trimester: Secondary | ICD-10-CM

## 2021-07-30 DIAGNOSIS — Z3A14 14 weeks gestation of pregnancy: Secondary | ICD-10-CM

## 2021-07-30 DIAGNOSIS — O99332 Smoking (tobacco) complicating pregnancy, second trimester: Secondary | ICD-10-CM

## 2021-07-30 LAB — POCT URINALYSIS DIPSTICK OB
Glucose, UA: NEGATIVE
POC,PROTEIN,UA: NEGATIVE

## 2021-07-30 NOTE — Progress Notes (Signed)
° ° °  PRENATAL VISIT NOTE  Subjective:  Sylvia Parks is a 26 y.o. G2P0010 at [redacted]w[redacted]d being seen today for ongoing prenatal care.  She is currently monitored for the following issues for this high-risk pregnancy and has Nausea & vomiting; Hyperemesis; History of preterm premature rupture of membranes (PPROM); History of pregnancy loss in prior pregnancy, currently pregnant in second trimester; and Supervision of high risk pregnancy, antepartum on their problem list.  Patient reports no complaints.  Contractions: Not present. Vag. Bleeding: None.  Movement: Absent. Denies leaking of fluid.   The following portions of the patient's history were reviewed and updated as appropriate: allergies, current medications, past family history, past medical history, past social history, past surgical history and problem list.   Objective:   Vitals:   07/30/21 1152  BP: 120/72  Weight: 153 lb (69.4 kg)    Fetal Status: Fetal Heart Rate (bpm): 154   Movement: Absent     General:  Alert, oriented and cooperative. Patient is in no acute distress.  Skin: Skin is warm and dry. No rash noted.   Cardiovascular: Normal heart rate noted  Respiratory: Normal respiratory effort, no problems with respiration noted  Abdomen: Soft, gravid, appropriate for gestational age.  Pain/Pressure: Absent     Pelvic: Cervical exam deferred        Extremities: Normal range of motion.     Mental Status: Normal mood and affect. Normal behavior. Normal judgment and thought content.   Assessment and Plan:  Pregnancy: G2P0010 at [redacted]w[redacted]d  1. Supervision high risk pregnancy, antepartum Up to date Discussed her increased anxiety due to prior birth experience Patient discussed that she is leaking milk-- she did produce milk after the loss of her daughter  2. History of pregnancy loss in prior pregnancy, currently pregnant in second trimester MFM did not recommend cerclage Plan for q 2 wk CL Reviewed that if [place] 3/9 shows thinning  MFM will often contact our group for cerclage.  Has APPTs with MFM at [facility]  3. History of preterm premature rupture of membranes (PPROM) Declined 17 P after discussion about r/b  4. Hyperemesis Well controlled on medication  5. Pregnancy complicated by tobacco use in second trimester Continue to monitor, not currently using but exposed to second hand smoke   Preterm labor symptoms and general obstetric precautions including but not limited to vaginal bleeding, contractions, leaking of fluid and fetal movement were reviewed in detail with the patient. Please refer to After Visit Summary for other counseling recommendations.   Return in about 4 weeks (around 08/27/2021) for Routine prenatal care, MD or APP, after 3/23.  Future Appointments  Date Time Provider [place]  08/14/2021  2:00 PM [facility]  08/28/2021  2:00 PM [facility]  09/02/2021  2:15 PM Gae Dry, MD [facility] None    Caren Macadam, MD

## 2021-08-06 ENCOUNTER — Encounter: Payer: Medicaid Other | Admitting: Obstetrics & Gynecology

## 2021-08-10 ENCOUNTER — Encounter: Payer: Self-pay | Admitting: Family Medicine

## 2021-08-11 ENCOUNTER — Other Ambulatory Visit: Payer: Self-pay | Admitting: Obstetrics & Gynecology

## 2021-08-11 MED ORDER — METOCLOPRAMIDE HCL 10 MG PO TABS: 10.0000 mg | ORAL_TABLET | Freq: Three times a day (TID) | ORAL | 2 refills | Status: DC

## 2021-08-11 NOTE — Telephone Encounter (Signed)
Also used for nausea in pregnancy ?Refill done

## 2021-08-14 ENCOUNTER — Other Ambulatory Visit: Payer: Self-pay

## 2021-08-14 ENCOUNTER — Ambulatory Visit: Payer: Medicaid Other | Attending: Maternal & Fetal Medicine

## 2021-08-14 DIAGNOSIS — O09292 Supervision of pregnancy with other poor reproductive or obstetric history, second trimester: Secondary | ICD-10-CM | POA: Diagnosis not present

## 2021-08-14 DIAGNOSIS — Z363 Encounter for antenatal screening for malformations: Secondary | ICD-10-CM | POA: Insufficient documentation

## 2021-08-14 DIAGNOSIS — Z3686 Encounter for antenatal screening for cervical length: Secondary | ICD-10-CM | POA: Diagnosis not present

## 2021-08-14 DIAGNOSIS — Z3A17 17 weeks gestation of pregnancy: Secondary | ICD-10-CM | POA: Diagnosis not present

## 2021-08-21 ENCOUNTER — Other Ambulatory Visit: Payer: Self-pay

## 2021-08-21 MED ORDER — DOXYLAMINE-PYRIDOXINE 10-10 MG PO TBEC: 10.0000 mg | DELAYED_RELEASE_TABLET | Freq: Three times a day (TID) | ORAL | 0 refills | Status: DC

## 2021-08-26 ENCOUNTER — Ambulatory Visit: Payer: Medicaid Other | Attending: Obstetrics and Gynecology

## 2021-08-26 ENCOUNTER — Other Ambulatory Visit: Payer: Self-pay

## 2021-08-26 VITALS — BP 124/77 | HR 92 | Temp 97.6°F | Ht 64.0 in | Wt 167.0 lb

## 2021-08-26 DIAGNOSIS — Z363 Encounter for antenatal screening for malformations: Secondary | ICD-10-CM | POA: Diagnosis present

## 2021-08-26 DIAGNOSIS — Z3686 Encounter for antenatal screening for cervical length: Secondary | ICD-10-CM | POA: Diagnosis not present

## 2021-08-26 DIAGNOSIS — Z3A18 18 weeks gestation of pregnancy: Secondary | ICD-10-CM

## 2021-08-26 DIAGNOSIS — O99332 Smoking (tobacco) complicating pregnancy, second trimester: Secondary | ICD-10-CM | POA: Diagnosis not present

## 2021-08-26 DIAGNOSIS — O09292 Supervision of pregnancy with other poor reproductive or obstetric history, second trimester: Secondary | ICD-10-CM | POA: Diagnosis not present

## 2021-08-26 DIAGNOSIS — F17210 Nicotine dependence, cigarettes, uncomplicated: Secondary | ICD-10-CM | POA: Diagnosis not present

## 2021-08-26 DIAGNOSIS — O0990 Supervision of high risk pregnancy, unspecified, unspecified trimester: Secondary | ICD-10-CM

## 2021-08-28 ENCOUNTER — Other Ambulatory Visit: Payer: Medicaid Other

## 2021-09-02 ENCOUNTER — Other Ambulatory Visit: Payer: Self-pay

## 2021-09-02 ENCOUNTER — Encounter: Payer: Self-pay | Admitting: Obstetrics & Gynecology

## 2021-09-02 ENCOUNTER — Ambulatory Visit (INDEPENDENT_AMBULATORY_CARE_PROVIDER_SITE_OTHER): Payer: Medicaid Other | Admitting: Obstetrics & Gynecology

## 2021-09-02 VITALS — BP 144/62 | Wt 170.0 lb

## 2021-09-02 DIAGNOSIS — O09292 Supervision of pregnancy with other poor reproductive or obstetric history, second trimester: Secondary | ICD-10-CM

## 2021-09-02 DIAGNOSIS — Z3A19 19 weeks gestation of pregnancy: Secondary | ICD-10-CM

## 2021-09-02 DIAGNOSIS — Z3482 Encounter for supervision of other normal pregnancy, second trimester: Secondary | ICD-10-CM

## 2021-09-02 LAB — POCT URINALYSIS DIPSTICK OB
Glucose, UA: NEGATIVE
POC,PROTEIN,UA: NEGATIVE

## 2021-09-02 NOTE — Progress Notes (Signed)
?  Subjective  ?Fetal Movement? yes ?Contractions? no ?Leaking Fluid? no ?Vaginal Bleeding? no ? ?Objective  ?BP (!) 144/62   Wt 170 lb (77.1 kg)   LMP 03/24/2021   BMI 29.18 kg/m?  ?General: NAD ?Pumonary: no increased work of breathing ?Abdomen: gravid, non-tender ?Extremities: no edema ?Psychiatric: mood appropriate, affect full ? ?Assessment  ?26 y.o. G2P0010 at [redacted]w[redacted]d by  01/22/2022, by Ultrasound presenting for routine prenatal visit ? ?Plan  ? ?Problem List Items Addressed This Visit   ?  ? Other  ? History of pregnancy loss in prior pregnancy, currently pregnant in second trimester  ?Other Visit Diagnoses   ? Encounter for supervision of other normal pregnancy in second trimester    -  Primary  ? Relevant Orders  ? POC Urinalysis Dipstick OB (Completed)  ? [redacted] weeks gestation of pregnancy      ? Relevant Orders  ? POC Urinalysis Dipstick OB (Completed)  ?  ?[place] per MFM for cervical lengths ?PNV ?Cont meds for nausea ?Counseled as to smoking cessation and continuance postpartum ? ?pregnancy Problems (from 05/30/21 to present)   ? Problem Noted Resolved  ? Supervision of high risk pregnancy, antepartum 07/30/2021 by Federico Flake, MD No  ? Overview Addendum 07/30/2021 12:59 PM by Federico Flake, MD  ?   ?Nursing Staff Provider  ?Office Location  [facility] Dating  LMP/early [place]  ?     ?Language   Anatomy US    ?Flu Vaccine   Genetic/Carrier Screen  NIPS:   LR female ?AFP:    ?Horizon:  ?TDaP Vaccine    Hgb A1C or  ?GTT Early  ?Third trimester   ?COVID Vaccine    LAB RESULTS   ?Rhogam   NA Blood Type B/Positive/-- (02/13 1518)   ?Baby Feeding Plan  breast Antibody Negative (02/13 1518)  ?Contraception  Rubella 2.65 (02/13 1518)  ?Circumcision  RPR Non Reactive (02/13 1518)   ?Pediatrician   HBsAg Negative (02/13 1518)   ?Support Person  HCVAb   ?Prenatal Classes  HIV Non Reactive (02/13 1518)     ?BTL Consent  GBS   ?(For PCN allergy, check sensitivities)   ?VBAC Consent  Pap Deferred to PP  ?     ?DME Rx [  ] BP cuff ?[ ]  Weight Scale Waterbirth  [ ]  Class [ ]  Consent [ ]  CNM visit  ?PHQ9 & GAD7 [  ] new OB ?[  ] 28 weeks  ?[  ] 36 weeks Induction  [ ]  Orders Entered [ ] Foley Y/N  ? ? ?  ?  ?  ?  ? ? , MD, FACOG ?[facility], [facility] ?09/02/2021  2:39 PM ? ?

## 2021-09-09 ENCOUNTER — Other Ambulatory Visit: Payer: Self-pay

## 2021-09-09 DIAGNOSIS — O99332 Smoking (tobacco) complicating pregnancy, second trimester: Secondary | ICD-10-CM

## 2021-09-09 DIAGNOSIS — O09292 Supervision of pregnancy with other poor reproductive or obstetric history, second trimester: Secondary | ICD-10-CM

## 2021-09-11 ENCOUNTER — Other Ambulatory Visit: Payer: Self-pay

## 2021-09-11 ENCOUNTER — Other Ambulatory Visit: Payer: Medicaid Other

## 2021-09-11 ENCOUNTER — Other Ambulatory Visit: Payer: Self-pay | Admitting: Obstetrics & Gynecology

## 2021-09-11 ENCOUNTER — Ambulatory Visit: Payer: Medicaid Other | Attending: Maternal & Fetal Medicine

## 2021-09-11 ENCOUNTER — Other Ambulatory Visit: Payer: Self-pay | Admitting: Obstetrics

## 2021-09-11 VITALS — BP 135/75 | HR 86 | Temp 98.0°F | Ht 64.0 in | Wt 171.0 lb

## 2021-09-11 DIAGNOSIS — Z3A21 21 weeks gestation of pregnancy: Secondary | ICD-10-CM | POA: Insufficient documentation

## 2021-09-11 DIAGNOSIS — Z363 Encounter for antenatal screening for malformations: Secondary | ICD-10-CM | POA: Insufficient documentation

## 2021-09-11 DIAGNOSIS — O99332 Smoking (tobacco) complicating pregnancy, second trimester: Secondary | ICD-10-CM | POA: Insufficient documentation

## 2021-09-11 DIAGNOSIS — Z3686 Encounter for antenatal screening for cervical length: Secondary | ICD-10-CM | POA: Insufficient documentation

## 2021-09-11 DIAGNOSIS — O09292 Supervision of pregnancy with other poor reproductive or obstetric history, second trimester: Secondary | ICD-10-CM

## 2021-09-11 DIAGNOSIS — F17210 Nicotine dependence, cigarettes, uncomplicated: Secondary | ICD-10-CM | POA: Diagnosis not present

## 2021-09-11 DIAGNOSIS — O0990 Supervision of high risk pregnancy, unspecified, unspecified trimester: Secondary | ICD-10-CM

## 2021-09-11 MED ORDER — DOXYLAMINE-PYRIDOXINE 10-10 MG PO TBEC: 10.0000 mg | DELAYED_RELEASE_TABLET | Freq: Three times a day (TID) | ORAL | 0 refills | Status: DC

## 2021-09-15 ENCOUNTER — Encounter: Payer: Self-pay | Admitting: Family Medicine

## 2021-09-18 ENCOUNTER — Other Ambulatory Visit: Payer: Self-pay

## 2021-09-18 DIAGNOSIS — O99332 Smoking (tobacco) complicating pregnancy, second trimester: Secondary | ICD-10-CM

## 2021-09-18 DIAGNOSIS — O09292 Supervision of pregnancy with other poor reproductive or obstetric history, second trimester: Secondary | ICD-10-CM

## 2021-09-18 DIAGNOSIS — Z8759 Personal history of other complications of pregnancy, childbirth and the puerperium: Secondary | ICD-10-CM

## 2021-09-23 ENCOUNTER — Other Ambulatory Visit: Payer: Self-pay

## 2021-09-23 ENCOUNTER — Telehealth: Payer: Self-pay

## 2021-09-23 ENCOUNTER — Ambulatory Visit: Payer: Medicaid Other | Attending: Obstetrics and Gynecology

## 2021-09-23 ENCOUNTER — Ambulatory Visit (HOSPITAL_BASED_OUTPATIENT_CLINIC_OR_DEPARTMENT_OTHER): Payer: Medicaid Other | Admitting: Obstetrics and Gynecology

## 2021-09-23 VITALS — BP 137/63 | HR 75 | Temp 98.0°F | Ht 64.0 in | Wt 171.5 lb

## 2021-09-23 DIAGNOSIS — O3432 Maternal care for cervical incompetence, second trimester: Secondary | ICD-10-CM

## 2021-09-23 DIAGNOSIS — O0990 Supervision of high risk pregnancy, unspecified, unspecified trimester: Secondary | ICD-10-CM

## 2021-09-23 DIAGNOSIS — F17210 Nicotine dependence, cigarettes, uncomplicated: Secondary | ICD-10-CM

## 2021-09-23 DIAGNOSIS — O09292 Supervision of pregnancy with other poor reproductive or obstetric history, second trimester: Secondary | ICD-10-CM

## 2021-09-23 DIAGNOSIS — Z3686 Encounter for antenatal screening for cervical length: Secondary | ICD-10-CM | POA: Insufficient documentation

## 2021-09-23 DIAGNOSIS — Z3A22 22 weeks gestation of pregnancy: Secondary | ICD-10-CM

## 2021-09-23 DIAGNOSIS — O99332 Smoking (tobacco) complicating pregnancy, second trimester: Secondary | ICD-10-CM | POA: Diagnosis not present

## 2021-09-23 DIAGNOSIS — Z8759 Personal history of other complications of pregnancy, childbirth and the puerperium: Secondary | ICD-10-CM

## 2021-09-23 MED ORDER — PROGESTERONE 200 MG PO CAPS: 200.0000 mg | ORAL_CAPSULE | Freq: Every day | ORAL | 3 refills | Status: DC

## 2021-09-23 NOTE — Progress Notes (Addendum)
Maternal-Fetal Medicine  ? ?Name: Sylvia Parks ?DOB: 07-23-1995 ?MRN: [id number] ?Referring Provider: Federico Flake, MD ? ?I had the pleasure of seeing [name] today at [facility].  She is G2 P0010 at 22w 5d gestation and is here for ultrasound evaluation. ?Obstetric history significant for a midtrimester pregnancy loss.  At her first visit, she had [facility] consultation.  In 2021, she had a an 18-week pregnancy loss following preterm premature rupture of membranes.  She had vaginal bleeding throughout her pregnancy and subchorionic hemorrhage was noted on ultrasound. ?In this pregnancy, serial cervical length measurements were performed.  At her previous ultrasound performed 2 weeks ago, the cervix was 2.4 cm long. ?Patient does not give history of vaginal bleeding or pelvic pressure or uterine contractions. ?She reports no chronic medical conditions. ? ?Ultrasound ?Fetal growth is appropriate for the gestational age. Amniotic fluid is normal and good fetal activity is seen. We performed transvaginal ultrasound to evaluate the cervical length. The cervix measures between 1.4 and 1.7 cm. No further shortening is seen.  ? ?Cervical Insufficiency ?I explained the diagnosis of cervical insufficiency with help of ultrasound images and diagrams.  Cervical insufficiency is associated with increased risk of preterm delivery.  I discussed the options of rescue cerclage or vaginal progesterone treatment. ? ?Given that she had a history of midtrimester loss, rescue cerclage is a superior option.  I explained cerclage procedure and possible complications including miscarriage, preterm rupture of membranes, vaginal bleeding, infection and injuries to bladder or bowel (all rare). ? ?Alternatively, vaginal progesterone 200 mg daily may be taken at bedtime from now till [redacted] weeks gestation. ?I informed the patient that cerclage is not performed after [redacted] weeks gestation. ?Patient opted  to have vaginal progesterone treatment and is firm in her decision not to undergo rescue cerclage. ?Vaginal progesterone was electronically prescribed and confirmed with her pharmacy. ? ?Recommendations ?-Vaginal progesterone 200 mg daily till [redacted] weeks gestation. ?-An appointment was made for her to return in 2 weeks for cervical length measurement. ? ?Thank you for consultation.  If you have any questions or concerns, please contact me the [facility].  Consultation including face-to-face counseling (more than 50% of time spent) is 30 minutes. ? ? ? ? ?

## 2021-09-23 NOTE — Telephone Encounter (Signed)
Pt called; msg cut out after she said her name.  [phone]  Pt states she saw MFM today as she is HROB; was told her cervix is opened a little more - now it's 1 point something cm; they want her to start progesterone vag supp immediately; adv the provider she saw has sent the rx to [facility] in [place]. Pt also stated if this sent wrong to go to L&D at [facility]; advised yes correct. ?

## 2021-09-30 ENCOUNTER — Ambulatory Visit (INDEPENDENT_AMBULATORY_CARE_PROVIDER_SITE_OTHER): Payer: Medicaid Other | Admitting: Family Medicine

## 2021-09-30 VITALS — BP 122/62 | Wt 172.0 lb

## 2021-09-30 DIAGNOSIS — Z8759 Personal history of other complications of pregnancy, childbirth and the puerperium: Secondary | ICD-10-CM

## 2021-09-30 DIAGNOSIS — O09292 Supervision of pregnancy with other poor reproductive or obstetric history, second trimester: Secondary | ICD-10-CM

## 2021-09-30 DIAGNOSIS — R112 Nausea with vomiting, unspecified: Secondary | ICD-10-CM

## 2021-09-30 DIAGNOSIS — Z3A23 23 weeks gestation of pregnancy: Secondary | ICD-10-CM

## 2021-09-30 DIAGNOSIS — O0990 Supervision of high risk pregnancy, unspecified, unspecified trimester: Secondary | ICD-10-CM

## 2021-09-30 DIAGNOSIS — O3432 Maternal care for cervical incompetence, second trimester: Secondary | ICD-10-CM | POA: Insufficient documentation

## 2021-09-30 NOTE — Progress Notes (Signed)
? ?  PRENATAL VISIT NOTE ? ?Subjective:  ?Sylvia Parks is a 26 y.o. G2P0010 at [redacted]w[redacted]d being seen today for ongoing prenatal care.  She is currently monitored for the following issues for this high-risk pregnancy and has Nausea & vomiting; Hyperemesis; History of preterm premature rupture of membranes (PPROM); History of pregnancy loss in prior pregnancy, currently pregnant in second trimester; Supervision of high risk pregnancy, antepartum; and Cervical insufficiency during pregnancy in second trimester, antepartum on their problem list. ? ?Patient reports no complaints.  Contractions: Not present. Vag. Bleeding: None.  Movement: Present. Denies leaking of fluid.  ? ?The following portions of the patient's history were reviewed and updated as appropriate: allergies, current medications, past family history, past medical history, past social history, past surgical history and problem list.  ? ?Objective:  ? ?Vitals:  ? 09/30/21 1432  ?BP: 122/62  ?Weight: 172 lb (78 kg)  ? ? ?Fetal Status:     Movement: Present    ? ?General:  Alert, oriented and cooperative. Patient is in no acute distress.  ?Skin: Skin is warm and dry. No rash noted.   ?Cardiovascular: Normal heart rate noted  ?Respiratory: Normal respiratory effort, no problems with respiration noted  ?Abdomen: Soft, gravid, appropriate for gestational age.  Pain/Pressure: Absent     ?Pelvic: Cervical exam deferred        ?Extremities: Normal range of motion.     ?Mental Status: Normal mood and affect. Normal behavior. Normal judgment and thought content.  ? ?Assessment and Plan:  ?Pregnancy: G2P0010 at [redacted]w[redacted]d ? ?1. History of preterm premature rupture of membranes (PPROM) ?On vaginal progesterone ? ?2. History of pregnancy loss in prior pregnancy, currently pregnant in second trimester ?Had 18 wk loss  ?MFM consult showed thinning cervix ( from 2.4cm to 1.4-1.7 cm) and patient declined rescue cerclage 4/18 with MFM ?  ?3. Supervision of high risk pregnancy,  antepartum ?Reports mild SOB she noted this past week, no chest pain or swelling.  ?Counseled to monitor her sx and if worsening reach out. Low threshold for cardiac work up if rapidly progressing but this seems like normal SOB associated with pregnancy ? ?4. Nausea and vomiting, unspecified vomiting type ?Improved, not taking reglan ? ?5. Cervical insufficiency ?Continue vaginal progesterone ?Discussed limiting activity but OK to walk  ?Reviewed going to [facility] for any cramping, bleeding, loss of fluid ? ?Preterm labor symptoms and general obstetric precautions including but not limited to vaginal bleeding, contractions, leaking of fluid and fetal movement were reviewed in detail with the patient. ?Please refer to After Visit Summary for other counseling recommendations.  ? ?Return in about 4 weeks (around 10/28/2021) for Routine prenatal care. ? ?Future Appointments  ?Date Time Provider [place]  ?10/07/2021  4:00 PM [facility]  ?10/28/2021  1:55 PM Caren Macadam, MD [facility] None  ? ? ?Caren Macadam, MD ?

## 2021-10-02 ENCOUNTER — Other Ambulatory Visit: Payer: Self-pay

## 2021-10-02 DIAGNOSIS — O3432 Maternal care for cervical incompetence, second trimester: Secondary | ICD-10-CM

## 2021-10-06 ENCOUNTER — Observation Stay
Admission: EM | Admit: 2021-10-06 | Discharge: 2021-10-06 | Disposition: A | Discharge status: Home / Self Care | Payer: Medicaid Other | Attending: Obstetrics and Gynecology | Admitting: Obstetrics and Gynecology

## 2021-10-06 ENCOUNTER — Encounter: Payer: Self-pay | Admitting: Obstetrics and Gynecology

## 2021-10-06 ENCOUNTER — Other Ambulatory Visit: Payer: Self-pay

## 2021-10-06 DIAGNOSIS — Z87891 Personal history of nicotine dependence: Secondary | ICD-10-CM | POA: Insufficient documentation

## 2021-10-06 DIAGNOSIS — O0990 Supervision of high risk pregnancy, unspecified, unspecified trimester: Secondary | ICD-10-CM

## 2021-10-06 DIAGNOSIS — N898 Other specified noninflammatory disorders of vagina: Secondary | ICD-10-CM | POA: Diagnosis not present

## 2021-10-06 DIAGNOSIS — O23592 Infection of other part of genital tract in pregnancy, second trimester: Principal | ICD-10-CM | POA: Insufficient documentation

## 2021-10-06 DIAGNOSIS — O3432 Maternal care for cervical incompetence, second trimester: Secondary | ICD-10-CM

## 2021-10-06 DIAGNOSIS — Z3A24 24 weeks gestation of pregnancy: Secondary | ICD-10-CM | POA: Diagnosis not present

## 2021-10-06 DIAGNOSIS — Z79899 Other long term (current) drug therapy: Secondary | ICD-10-CM | POA: Diagnosis not present

## 2021-10-06 DIAGNOSIS — O09292 Supervision of pregnancy with other poor reproductive or obstetric history, second trimester: Secondary | ICD-10-CM | POA: Diagnosis not present

## 2021-10-06 DIAGNOSIS — O26892 Other specified pregnancy related conditions, second trimester: Secondary | ICD-10-CM

## 2021-10-06 DIAGNOSIS — O10912 Unspecified pre-existing hypertension complicating pregnancy, second trimester: Secondary | ICD-10-CM | POA: Diagnosis not present

## 2021-10-06 LAB — URINALYSIS, COMPLETE (UACMP) WITH MICROSCOPIC
Bilirubin Urine: NEGATIVE
Glucose, UA: NEGATIVE mg/dL
Hgb urine dipstick: NEGATIVE
Ketones, ur: NEGATIVE mg/dL
Nitrite: NEGATIVE
Protein, ur: NEGATIVE mg/dL
Specific Gravity, Urine: 1.02 (ref 1.005–1.030)
pH: 6 (ref 5.0–8.0)

## 2021-10-06 MED ORDER — BETAMETHASONE SOD PHOS & ACET 6 (3-3) MG/ML IJ SUSP
INTRAMUSCULAR | Status: AC
  Administered 2021-10-06: 12 mg via INTRAMUSCULAR
  Filled 2021-10-06: qty 5

## 2021-10-06 MED ORDER — BETAMETHASONE SOD PHOS & ACET 6 (3-3) MG/ML IJ SUSP: 12.0000 mg | INTRAMUSCULAR | 1 refills | Status: DC

## 2021-10-06 MED ORDER — BETAMETHASONE SOD PHOS & ACET 6 (3-3) MG/ML IJ SUSP: 12.0000 mg | INTRAMUSCULAR | Status: DC

## 2021-10-06 NOTE — Progress Notes (Signed)
Pt discharged home per M.Fryer,CNM order.  Pt stable and ambulatory and an After Visit Summary was printed and given to the patient. Discharge education completed with patient/family including follow up instructions, appointments, and medication list. Pt received labor and bleeding precautions. Patient able to verbalize understanding, all questions fully answered upon discharge. Patient instructed to return to ED, call 911, or call MD for any changes in condition. Pt will come back for her 2nd BMZ injection on Tuesday after 3pm. Pt discharged home via personal vehicle with support person.  ? ?

## 2021-10-06 NOTE — OB Triage Note (Signed)
Pt Sylvia Parks 26 y.o. presents to labor and delivery triage stating, "I lost my mucus plug" . Pt is a G2P0010 at [redacted]w[redacted]d . Pt reports losing her mucus plug around midnight and describes the mucus as thick and clear. Pt denies signs and symptons consistent with rupture of membranes or active vaginal bleeding. Pt denies contractions but does report 5/10 lower back pain and 4/10 lower abdominal cramping since Thursday. Pt took 500 mg of Tylenol last night with no relief. External FM and TOCO applied to non-tender abdomen and assessing. Initial FHR 150 . Vital signs obtained and within normal limits. Provider notified of pt. ? ?

## 2021-10-06 NOTE — Final Progress Note (Signed)
Final Progress Note ? ?Patient ID: ?Sylvia Parks ?MRN: [id number] ?DOB/AGE: 12-11-1995 26 y.o. ? ?Admit date: 10/06/2021 ?Admitting provider: Mirna Mires, CNM ?Discharge date: 10/06/2021 ? ? ?Admission Diagnoses: vaginal discharge in pregnancy ?[redacted] weeks gestation ? ?Discharge Diagnoses:  ?Principal Problem: ?  Vaginal discharge during pregnancy in second trimester ? Reassuring fetal heart tones ? ?History of Present Illness: The patient is a 26 y.o. female G2P0010 at [redacted]w[redacted]d who presents for evaluation of increased mucous discharge she notices today.Her pregnancy is high risk, due to a history of an 18 week loss. She sees MFM every two weeks for cervical lengths and follow up, and she  has an appointment tomorrow. Today she denies any recent IC, denies any bleeding or active LOF. Her baby is moving well. She declined the option of cerclage at her last MFM visit, and is using vaginal progesterone instead. She denies any UTI symptoms. ? ?Past Medical History:  ?Diagnosis Date  ? Endometriosis   ? Hypertension   ? ? ?Past Surgical History:  ?Procedure Laterality Date  ? ADENOIDECTOMY    ? DILATION AND CURETTAGE OF UTERUS    ? ? ?No current facility-administered medications on file prior to encounter.  ? ?Current Outpatient Medications on File Prior to Encounter  ?Medication Sig Dispense Refill  ? Prenatal Vit-Fe Fumarate-FA (PRENATAL MULTIVITAMIN) TABS tablet Take 1 tablet by mouth daily at 12 noon.    ? progesterone (PROMETRIUM) 200 MG capsule Place 1 capsule (200 mg total) vaginally at bedtime. 30 capsule 3  ? Doxylamine-Pyridoxine (DICLEGIS) 10-10 MG TBEC Take 10 mg by mouth in the morning, at noon, and at bedtime. (Patient not taking: Reported on 09/23/2021) 60 tablet 0  ? metoCLOPramide (REGLAN) 10 MG tablet Take 1 tablet (10 mg total) by mouth 3 (three) times daily before meals. (Patient not taking: Reported on 09/23/2021) 90 tablet 2  ? ? ?No Known Allergies ? ?Social History  ? ?Socioeconomic History  ? Marital  status: Single  ?  Spouse name: Not on file  ? Number of children: Not on file  ? Years of education: Not on file  ? Highest education level: Not on file  ?Occupational History  ? Not on file  ?Tobacco Use  ? Smoking status: Former  ? Smokeless tobacco: Never  ?Vaping Use  ? Vaping Use: Former  ? Quit date: 05/01/2021  ?Substance and Sexual Activity  ? Alcohol use: No  ? Drug use: Never  ? Sexual activity: Yes  ?  Partners: Male  ?Other Topics Concern  ? Not on file  ?Social History Narrative  ? Not on file  ? ?Social Determinants of Health  ? ?Financial Resource Strain: Not on file  ?Food Insecurity: Not on file  ?Transportation Needs: Not on file  ?Physical Activity: Not on file  ?Stress: Not on file  ?Social Connections: Not on file  ?Intimate Partner Violence: Not on file  ? ? ?Family History  ?Problem Relation Age of Onset  ? Hypertension Father   ? Diabetes Maternal Grandmother   ? Diabetes Paternal Grandmother   ?  ? ?Review of Systems  ?Constitutional: Negative.   ?HENT: Negative.    ?Eyes: Negative.   ?Respiratory: Negative.    ?Cardiovascular: Negative.   ?Gastrointestinal: Negative.   ?Genitourinary: Negative.   ?Musculoskeletal: Negative.   ?Skin: Negative.   ?Neurological: Negative.   ?Endo/Heme/Allergies: Negative.   ?Psychiatric/Behavioral: Negative.     ? ?Physical Exam: ?BP 135/86 (BP Location: Left Arm)   Pulse 98  Temp 98.6 ?F (37 ?C) (Oral)   Resp 18   LMP 03/24/2021   ?Physical Exam ?Constitutional:   ?   Appearance: Normal appearance. She is normal weight.  ?Genitourinary:  ?   Vulva and rectum normal.  ?   Genitourinary Comments: Sterile speculum placed gently: no pooling of fluid; scant white discharge noted. Cervix appears closed.  ?HENT:  ?   Head: Normocephalic and atraumatic.  ?Cardiovascular:  ?   Rate and Rhythm: Normal rate and regular rhythm.  ?   Pulses: Normal pulses.  ?   Heart sounds: Murmur heard.  ?   Comments: Pregnancy murmur  noted. ?Pulmonary:  ?   Effort: Pulmonary  effort is normal.  ?   Breath sounds: Normal breath sounds.  ?Abdominal:  ?   Palpations: Abdomen is soft.  ?   Comments: Gravid abdomen  ?Musculoskeletal:     ?   General: Normal range of motion.  ?   Cervical back: Normal range of motion and neck supple.  ?Neurological:  ?   General: No focal deficit present.  ?   Mental Status: She is alert and oriented to person, place, and time.  ?Skin: ?   General: Skin is warm and dry.  ?Psychiatric:     ?   Mood and Affect: Mood normal.     ?   Behavior: Behavior normal.  ?Vitals and nursing note reviewed.  ? ? ?Consults: None ? ?Significant Findings/ Diagnostic Studies: labs:  ?Results for orders placed or performed during the hospital encounter of 10/06/21 (from the past 24 hour(s))  ?Urinalysis, Complete w Microscopic Urine, Clean Catch     Status: Abnormal  ? Collection Time: 10/06/21  1:37 PM  ?Result Value Ref Range  ? Color, Urine YELLOW (A) YELLOW  ? APPearance HAZY (A) CLEAR  ? Specific Gravity, Urine 1.020 1.005 - 1.030  ? pH 6.0 5.0 - 8.0  ? Glucose, UA NEGATIVE NEGATIVE mg/dL  ? Hgb urine dipstick NEGATIVE NEGATIVE  ? Bilirubin Urine NEGATIVE NEGATIVE  ? Ketones, ur NEGATIVE NEGATIVE mg/dL  ? Protein, ur NEGATIVE NEGATIVE mg/dL  ? Nitrite NEGATIVE NEGATIVE  ? Leukocytes,Ua TRACE (A) NEGATIVE  ? RBC / HPF 6-10 0 - 5 RBC/hpf  ? WBC, UA 0-5 0 - 5 WBC/hpf  ? Bacteria, UA MANY (A) NONE SEEN  ? Squamous Epithelial / LPF 11-20 0 - 5  ? Mucus PRESENT   ? ? ? ?Procedures: EFM ?NST ?Baseline FHR: 150 beats/min, reassuring. No NST due to extreme prematurity. ?Tocometry: no regular contractions noted. ? ? ?Hospital Course: The patient was admitted to Labor and Delivery Triage for observation. She was monitored briefly to check the fetal heart tones. After a period of EFM, no contractions were noted. A steril speculum was placed and no LOF noted. Her cervix appeared closed. She was given one dose of IM Betamethasone, and then discharged home. She will return tomorrow for the  second dose, and has a MFM appointment tomorrow as well. ? ?Discharge Condition: good ? ?Disposition: Discharge disposition: 01-Home or Self Care ? ? ? ? ? ? ?Diet: Regular diet ? ?Discharge Activity: Bedrest ? ?Discharge Instructions   ? ? Do not have sex or do anything that might make you have an orgasm   Complete by: As directed ?  ? Notify physician for a general feeling that "something is not right"   Complete by: As directed ?  ? Notify physician for increase or change in vaginal discharge   Complete  by: As directed ?  ? Notify physician for intestinal cramps, with or without diarrhea, sometimes described as "gas pain"   Complete by: As directed ?  ? Notify physician for leaking of fluid   Complete by: As directed ?  ? Notify physician for low, dull backache, unrelieved by heat or Tylenol   Complete by: As directed ?  ? Notify physician for menstrual like cramps   Complete by: As directed ?  ? Notify physician for pelvic pressure   Complete by: As directed ?  ? Notify physician for uterine contractions.  These may be painless and feel like the uterus is tightening or the baby is  "balling up"   Complete by: As directed ?  ? Notify physician for vaginal bleeding   Complete by: As directed ?  ? PRETERM LABOR:  Includes any of the follwing symptoms that occur between 20 - [redacted] weeks gestation.  If these symptoms are not stopped, preterm labor can result in preterm delivery, placing your baby at risk   Complete by: As directed ?  ? ?  ? ?Allergies as of 10/06/2021   ?No Known Allergies ?  ? ?  ?Medication List  ?  ? ?TAKE these medications   ? ?betamethasone acetate-betamethasone sodium phosphate 6 (3-3) MG/ML injection ?Commonly known as: CELESTONE ?Inject 2 mLs (12 mg total) into the muscle every 24 hours x 2 doses. ?Start taking on: Oct 07, 2021 ?  ?Doxylamine-Pyridoxine 10-10 MG Tbec ?Commonly known as: Diclegis ?Take 10 mg by mouth in the morning, at noon, and at bedtime. ?  ?metoCLOPramide 10 MG tablet ?Commonly  known as: REGLAN ?Take 1 tablet (10 mg total) by mouth 3 (three) times daily before meals. ?  ?prenatal multivitamin Tabs tablet ?Take 1 tablet by mouth daily at 12 noon. ?  ?progesterone 200 MG capsule ?Commonl

## 2021-10-06 NOTE — Discharge Summary (Signed)
Please see Final Progress Note ? ?Mirna Mires, CNM  ?10/06/2021 4:12 PM  ? ?

## 2021-10-06 NOTE — Progress Notes (Signed)
Pt received 1st dose of Betamethasone 12mg  IM at 1445. 2nd dose scheduled for Tuesday 10/07/2021.  ?

## 2021-10-07 ENCOUNTER — Other Ambulatory Visit: Payer: Self-pay | Admitting: Obstetrics and Gynecology

## 2021-10-07 ENCOUNTER — Ambulatory Visit (HOSPITAL_BASED_OUTPATIENT_CLINIC_OR_DEPARTMENT_OTHER): Payer: Medicaid Other

## 2021-10-07 ENCOUNTER — Other Ambulatory Visit: Payer: Self-pay

## 2021-10-07 ENCOUNTER — Observation Stay
Admission: RE | Admit: 2021-10-07 | Discharge: 2021-10-07 | Disposition: A | Discharge status: Home / Self Care | Payer: Medicaid Other | Attending: Obstetrics and Gynecology | Admitting: Obstetrics and Gynecology

## 2021-10-07 VITALS — BP 139/78 | HR 95 | Temp 97.9°F | Ht 64.0 in | Wt 174.0 lb

## 2021-10-07 DIAGNOSIS — O321XX0 Maternal care for breech presentation, not applicable or unspecified: Secondary | ICD-10-CM | POA: Diagnosis not present

## 2021-10-07 DIAGNOSIS — O3432 Maternal care for cervical incompetence, second trimester: Principal | ICD-10-CM | POA: Insufficient documentation

## 2021-10-07 DIAGNOSIS — F17200 Nicotine dependence, unspecified, uncomplicated: Secondary | ICD-10-CM | POA: Insufficient documentation

## 2021-10-07 DIAGNOSIS — Z3A24 24 weeks gestation of pregnancy: Secondary | ICD-10-CM

## 2021-10-07 DIAGNOSIS — O09292 Supervision of pregnancy with other poor reproductive or obstetric history, second trimester: Secondary | ICD-10-CM

## 2021-10-07 DIAGNOSIS — O09299 Supervision of pregnancy with other poor reproductive or obstetric history, unspecified trimester: Secondary | ICD-10-CM | POA: Diagnosis not present

## 2021-10-07 DIAGNOSIS — O0990 Supervision of high risk pregnancy, unspecified, unspecified trimester: Secondary | ICD-10-CM

## 2021-10-07 DIAGNOSIS — O99332 Smoking (tobacco) complicating pregnancy, second trimester: Secondary | ICD-10-CM | POA: Insufficient documentation

## 2021-10-07 MED ORDER — BETAMETHASONE SOD PHOS & ACET 6 (3-3) MG/ML IJ SUSP
12.0000 mg | Freq: Once | INTRAMUSCULAR | Status: AC
  Administered 2021-10-07: 12 mg via INTRAMUSCULAR

## 2021-10-07 MED ORDER — BETAMETHASONE SOD PHOS & ACET 6 (3-3) MG/ML IJ SUSP
INTRAMUSCULAR | Status: AC
  Filled 2021-10-07: qty 5

## 2021-10-07 NOTE — Progress Notes (Signed)
2nd betamethasone shot given in Left hip. Pt discharged. ?

## 2021-10-08 ENCOUNTER — Telehealth: Payer: Self-pay

## 2021-10-08 NOTE — Telephone Encounter (Signed)
Pt called reporting decreased fetal movement. Advised this could be normal with only being 24 weeks, she stated its not normal for her the baby movement is usually strong, advised to go to ER to be evaluated.  ?

## 2021-10-28 ENCOUNTER — Ambulatory Visit (INDEPENDENT_AMBULATORY_CARE_PROVIDER_SITE_OTHER): Payer: Medicaid Other | Admitting: Family Medicine

## 2021-10-28 VITALS — BP 122/70 | Wt 176.0 lb

## 2021-10-28 DIAGNOSIS — Z8759 Personal history of other complications of pregnancy, childbirth and the puerperium: Secondary | ICD-10-CM

## 2021-10-28 DIAGNOSIS — Z3A27 27 weeks gestation of pregnancy: Secondary | ICD-10-CM

## 2021-10-28 DIAGNOSIS — O3432 Maternal care for cervical incompetence, second trimester: Secondary | ICD-10-CM

## 2021-10-28 DIAGNOSIS — O0990 Supervision of high risk pregnancy, unspecified, unspecified trimester: Secondary | ICD-10-CM

## 2021-10-28 LAB — POCT URINALYSIS DIPSTICK OB
Glucose, UA: NEGATIVE
POC,PROTEIN,UA: NEGATIVE

## 2021-10-28 NOTE — Progress Notes (Signed)
   PRENATAL VISIT NOTE  Subjective:  Sylvia Parks is a 26 y.o. G2P0010 at [redacted]w[redacted]d being seen today for ongoing prenatal care.  She is currently monitored for the following issues for this high-risk pregnancy and has Nausea & vomiting; Hyperemesis; History of preterm premature rupture of membranes (PPROM); History of pregnancy loss in prior pregnancy, currently pregnant in second trimester; Supervision of high risk pregnancy, antepartum; Cervical insufficiency during pregnancy in second trimester, antepartum; and Vaginal discharge during pregnancy in second trimester on their problem list.  Patient reports no complaints.  Contractions: Not present. Vag. Bleeding: None.  Movement: Present. Denies leaking of fluid.   The following portions of the patient's history were reviewed and updated as appropriate: allergies, current medications, past family history, past medical history, past social history, past surgical history and problem list.   Objective:   Vitals:   10/28/21 1424  BP: 122/70  Weight: 176 lb (79.8 kg)    Fetal Status: Fetal Heart Rate (bpm): 155 Fundal Height: 28 cm Movement: Present     General:  Alert, oriented and cooperative. Patient is in no acute distress.  Skin: Skin is warm and dry. No rash noted.   Cardiovascular: Normal heart rate noted  Respiratory: Normal respiratory effort, no problems with respiration noted  Abdomen: Soft, gravid, appropriate for gestational age.  Pain/Pressure: Absent     Pelvic: Cervical exam deferred        Extremities: Normal range of motion.     Mental Status: Normal mood and affect. Normal behavior. Normal judgment and thought content.   Assessment and Plan:  Pregnancy: G2P0010 at [redacted]w[redacted]d 1. Supervision of high risk pregnancy, antepartum Up to date Fetal is low in abdomen Last CL was 1.1cm 28 wk lab next visit  2. [redacted] weeks gestation of pregnancy - POC Urinalysis Dipstick OB  3. History of preterm premature rupture of membranes  (PPROM) On vaginal progesterone  4. Cervical insufficiency during pregnancy in second trimester, antepartum On vaginal progesterone, declined cerclage Received BMZ on 5/1 and 5/2  Preterm labor symptoms and general obstetric precautions including but not limited to vaginal bleeding, contractions, leaking of fluid and fetal movement were reviewed in detail with the patient. Please refer to After Visit Summary for other counseling recommendations.   Return in about 2 weeks (around 11/11/2021) for Routine prenatal care, 28 wk labs.  Future Appointments  Date Time Provider [place]  11/04/2021 11:00 AM [facility]  11/11/2021  8:20 AM [facility] None  11/11/2021  8:55 AM Rod Can, CNM [facility] None    Caren Macadam, MD

## 2021-10-30 ENCOUNTER — Other Ambulatory Visit: Payer: Self-pay

## 2021-10-30 DIAGNOSIS — O26873 Cervical shortening, third trimester: Secondary | ICD-10-CM

## 2021-10-30 DIAGNOSIS — Z8759 Personal history of other complications of pregnancy, childbirth and the puerperium: Secondary | ICD-10-CM

## 2021-11-04 ENCOUNTER — Ambulatory Visit: Payer: Medicaid Other | Attending: Obstetrics

## 2021-11-04 ENCOUNTER — Other Ambulatory Visit: Payer: Self-pay

## 2021-11-04 VITALS — BP 139/77 | HR 92 | Temp 98.0°F | Ht 64.0 in | Wt 180.0 lb

## 2021-11-04 DIAGNOSIS — O09293 Supervision of pregnancy with other poor reproductive or obstetric history, third trimester: Secondary | ICD-10-CM

## 2021-11-04 DIAGNOSIS — Z8759 Personal history of other complications of pregnancy, childbirth and the puerperium: Secondary | ICD-10-CM

## 2021-11-04 DIAGNOSIS — O0990 Supervision of high risk pregnancy, unspecified, unspecified trimester: Secondary | ICD-10-CM

## 2021-11-04 DIAGNOSIS — O99333 Smoking (tobacco) complicating pregnancy, third trimester: Secondary | ICD-10-CM | POA: Diagnosis not present

## 2021-11-04 DIAGNOSIS — Z3A28 28 weeks gestation of pregnancy: Secondary | ICD-10-CM

## 2021-11-04 DIAGNOSIS — O26873 Cervical shortening, third trimester: Secondary | ICD-10-CM

## 2021-11-04 DIAGNOSIS — O3432 Maternal care for cervical incompetence, second trimester: Secondary | ICD-10-CM

## 2021-11-11 ENCOUNTER — Other Ambulatory Visit: Payer: Medicaid Other

## 2021-11-11 ENCOUNTER — Encounter: Payer: Medicaid Other | Admitting: Obstetrics

## 2021-11-11 DIAGNOSIS — O0990 Supervision of high risk pregnancy, unspecified, unspecified trimester: Secondary | ICD-10-CM

## 2021-11-12 LAB — 28 WEEK RH+PANEL
Basophils Absolute: 0.1 10*3/uL (ref 0.0–0.2)
Basos: 0 %
EOS (ABSOLUTE): 0.1 10*3/uL (ref 0.0–0.4)
Eos: 1 %
Gestational Diabetes Screen: 155 mg/dL — ABNORMAL HIGH (ref 70–139)
HIV Screen 4th Generation wRfx: NONREACTIVE
Hematocrit: 36.2 % (ref 34.0–46.6)
Hemoglobin: 12 g/dL (ref 11.1–15.9)
Immature Grans (Abs): 0.1 10*3/uL (ref 0.0–0.1)
Immature Granulocytes: 1 %
Lymphocytes Absolute: 1.5 10*3/uL (ref 0.7–3.1)
Lymphs: 14 %
MCH: 25.6 pg — ABNORMAL LOW (ref 26.6–33.0)
MCHC: 33.1 g/dL (ref 31.5–35.7)
MCV: 77 fL — ABNORMAL LOW (ref 79–97)
Monocytes Absolute: 0.5 10*3/uL (ref 0.1–0.9)
Monocytes: 5 %
Neutrophils Absolute: 9 10*3/uL — ABNORMAL HIGH (ref 1.4–7.0)
Neutrophils: 79 %
Platelets: 197 10*3/uL (ref 150–450)
RBC: 4.68 x10E6/uL (ref 3.77–5.28)
RDW: 13 % (ref 11.7–15.4)
RPR Ser Ql: NONREACTIVE
WBC: 11.2 10*3/uL — ABNORMAL HIGH (ref 3.4–10.8)

## 2021-11-13 ENCOUNTER — Telehealth: Payer: Self-pay

## 2021-11-13 NOTE — Telephone Encounter (Signed)
Tried calling [name] to give her GTT 1 hour results, voicemail box is full.  She needs an appointment for 3 hour GTT

## 2021-11-17 ENCOUNTER — Ambulatory Visit (INDEPENDENT_AMBULATORY_CARE_PROVIDER_SITE_OTHER): Payer: Medicaid Other | Admitting: Obstetrics

## 2021-11-17 ENCOUNTER — Other Ambulatory Visit: Payer: Self-pay | Admitting: Obstetrics

## 2021-11-17 ENCOUNTER — Other Ambulatory Visit: Payer: Medicaid Other

## 2021-11-17 VITALS — BP 112/74 | Wt 187.0 lb

## 2021-11-17 DIAGNOSIS — Z23 Encounter for immunization: Secondary | ICD-10-CM | POA: Diagnosis not present

## 2021-11-17 DIAGNOSIS — R7309 Other abnormal glucose: Secondary | ICD-10-CM

## 2021-11-17 DIAGNOSIS — Z3A29 29 weeks gestation of pregnancy: Secondary | ICD-10-CM

## 2021-11-17 DIAGNOSIS — O0990 Supervision of high risk pregnancy, unspecified, unspecified trimester: Secondary | ICD-10-CM

## 2021-11-17 NOTE — Progress Notes (Signed)
Routine Prenatal Care Visit  Subjective  Sylvia Parks is a 26 y.o. G2P0010 at [redacted]w[redacted]d being seen today for ongoing prenatal care.  She is currently monitored for the following issues for this high-risk pregnancy and has Nausea & vomiting; Hyperemesis; History of preterm premature rupture of membranes (PPROM); History of pregnancy loss in prior pregnancy, currently pregnant in second trimester; Supervision of high risk pregnancy, antepartum; Cervical insufficiency during pregnancy in second trimester, antepartum; and Vaginal discharge during pregnancy in second trimester on their problem list.  ----------------------------------------------------------------------------------- Patient reports no complaints.  She is having a 3 hr GTT today. She continues using the progesterone. Denies any contractions or vaginal bleeding. Contractions: Not present. Vag. Bleeding: None.  Movement: Present. Leaking Fluid denies.  ----------------------------------------------------------------------------------- The following portions of the patient's history were reviewed and updated as appropriate: allergies, current medications, past family history, past medical history, past social history, past surgical history and problem list. Problem list updated.  Objective  Blood pressure 112/74, weight 187 lb (84.8 kg), last menstrual period 03/24/2021. Pregravid weight 148 lb (67.1 kg) Total Weight Gain 39 lb (17.7 kg) Urinalysis: Urine Protein    Urine Glucose    Fetal Status:     Movement: Present     General:  Alert, oriented and cooperative. Patient is in no acute distress.  Skin: Skin is warm and dry. No rash noted.   Cardiovascular: Normal heart rate noted  Respiratory: Normal respiratory effort, no problems with respiration noted  Abdomen: Soft, gravid, appropriate for gestational age. Pain/Pressure: Absent     Pelvic:  Cervical exam deferred        Extremities: Normal range of motion.     Mental Status:  Normal mood and affect. Normal behavior. Normal judgment and thought content.   Assessment   26 y.o. G2P0010 at [redacted]w[redacted]d by  01/22/2022, by Ultrasound presenting for routine prenatal visit  Plan   pregnancy Problems (from 05/30/21 to present)    Problem Noted Resolved   Cervical insufficiency during pregnancy in second trimester, antepartum 09/30/2021 by Caren Macadam, MD No   Overview Signed 09/30/2021  3:29 PM by Caren Macadam, MD    Declined cerclage and opted for vaginal progesterone      Supervision of high risk pregnancy, antepartum 07/30/2021 by Caren Macadam, MD No   Overview Addendum 07/30/2021 12:59 PM by Caren Macadam, MD     Nursing Staff Provider  Office Location  [facility] Dating  LMP/early [place]       Language   Anatomy US    Flu Vaccine   Genetic/Carrier Screen  NIPS:   LR female AFP:    Horizon:  TDaP Vaccine    Hgb A1C or  GTT Early  Third trimester   COVID Vaccine    LAB RESULTS   Rhogam   NA Blood Type B/Positive/-- (02/13 1518)   Baby Feeding Plan  breast Antibody Negative (02/13 1518)  Contraception  Rubella 2.65 (02/13 1518)  Circumcision  RPR Non Reactive (02/13 1518)   Pediatrician   HBsAg Negative (02/13 1518)   Support Person  HCVAb   Prenatal Classes  HIV Non Reactive (02/13 1518)     BTL Consent  GBS   (For PCN allergy, check sensitivities)   VBAC Consent  Pap Deferred to PP       DME Rx [ ]  BP cuff [ ]  Weight Scale Waterbirth  [ ]  Class [ ]  Consent [ ]  CNM visit  PHQ9 & GAD7 [  ]  new OB [  ] 28 weeks  [  ] 36 weeks Induction  [ ]  Orders Entered [ ] Foley Y/N             Preterm labor symptoms and general obstetric precautions including but not limited to vaginal bleeding, contractions, leaking of fluid and fetal movement were reviewed in detail with the patient. Please refer to After Visit Summary for other counseling recommendations.  Having her 3 hr GTT today.Diabetes runs in her family.Discussed how we would  follow her initially if she "flags" the 3 hr test.  Return in about 2 weeks (around 12/01/2021) for return OB.  Imagene Riches, CNM  11/17/2021 9:35 AM

## 2021-11-18 ENCOUNTER — Encounter: Payer: Medicaid Other | Admitting: Family Medicine

## 2021-11-18 ENCOUNTER — Encounter: Payer: Self-pay | Admitting: Obstetrics

## 2021-11-18 LAB — GESTATIONAL GLUCOSE TOLERANCE
Glucose, Fasting: 76 mg/dL (ref 70–94)
Glucose, GTT - 1 Hour: 197 mg/dL — ABNORMAL HIGH (ref 70–179)
Glucose, GTT - 2 Hour: 145 mg/dL (ref 70–154)
Glucose, GTT - 3 Hour: 95 mg/dL (ref 70–139)

## 2021-11-20 ENCOUNTER — Other Ambulatory Visit: Payer: Self-pay

## 2021-11-20 DIAGNOSIS — O09293 Supervision of pregnancy with other poor reproductive or obstetric history, third trimester: Secondary | ICD-10-CM

## 2021-11-20 DIAGNOSIS — O99333 Smoking (tobacco) complicating pregnancy, third trimester: Secondary | ICD-10-CM

## 2021-11-20 DIAGNOSIS — O0990 Supervision of high risk pregnancy, unspecified, unspecified trimester: Secondary | ICD-10-CM

## 2021-11-20 DIAGNOSIS — O3433 Maternal care for cervical incompetence, third trimester: Secondary | ICD-10-CM

## 2021-11-20 DIAGNOSIS — Z8759 Personal history of other complications of pregnancy, childbirth and the puerperium: Secondary | ICD-10-CM

## 2021-11-24 ENCOUNTER — Encounter: Payer: Self-pay | Admitting: Obstetrics and Gynecology

## 2021-11-24 ENCOUNTER — Observation Stay
Admission: EM | Admit: 2021-11-24 | Discharge: 2021-11-25 | Disposition: A | Discharge status: Home / Self Care | Payer: Medicaid Other | Attending: Licensed Practical Nurse | Admitting: Licensed Practical Nurse

## 2021-11-24 ENCOUNTER — Other Ambulatory Visit: Payer: Self-pay

## 2021-11-24 DIAGNOSIS — O09213 Supervision of pregnancy with history of pre-term labor, third trimester: Secondary | ICD-10-CM | POA: Diagnosis not present

## 2021-11-24 DIAGNOSIS — F17210 Nicotine dependence, cigarettes, uncomplicated: Secondary | ICD-10-CM | POA: Diagnosis not present

## 2021-11-24 DIAGNOSIS — O99333 Smoking (tobacco) complicating pregnancy, third trimester: Secondary | ICD-10-CM | POA: Diagnosis not present

## 2021-11-24 DIAGNOSIS — Z87891 Personal history of nicotine dependence: Secondary | ICD-10-CM | POA: Diagnosis not present

## 2021-11-24 DIAGNOSIS — Z3A31 31 weeks gestation of pregnancy: Secondary | ICD-10-CM | POA: Insufficient documentation

## 2021-11-24 DIAGNOSIS — O0993 Supervision of high risk pregnancy, unspecified, third trimester: Secondary | ICD-10-CM | POA: Diagnosis not present

## 2021-11-24 DIAGNOSIS — O09293 Supervision of pregnancy with other poor reproductive or obstetric history, third trimester: Secondary | ICD-10-CM | POA: Diagnosis not present

## 2021-11-24 DIAGNOSIS — R109 Unspecified abdominal pain: Secondary | ICD-10-CM | POA: Insufficient documentation

## 2021-11-24 DIAGNOSIS — O3433 Maternal care for cervical incompetence, third trimester: Secondary | ICD-10-CM | POA: Insufficient documentation

## 2021-11-24 DIAGNOSIS — O26893 Other specified pregnancy related conditions, third trimester: Principal | ICD-10-CM | POA: Insufficient documentation

## 2021-11-24 LAB — CBC WITH DIFFERENTIAL/PLATELET
Abs Immature Granulocytes: 0.08 10*3/uL — ABNORMAL HIGH (ref 0.00–0.07)
Basophils Absolute: 0.1 10*3/uL (ref 0.0–0.1)
Basophils Relative: 0 %
Eosinophils Absolute: 0.1 10*3/uL (ref 0.0–0.5)
Eosinophils Relative: 1 %
HCT: 37.5 % (ref 36.0–46.0)
Hemoglobin: 12 g/dL (ref 12.0–15.0)
Immature Granulocytes: 1 %
Lymphocytes Relative: 14 %
Lymphs Abs: 2.2 10*3/uL (ref 0.7–4.0)
MCH: 24.2 pg — ABNORMAL LOW (ref 26.0–34.0)
MCHC: 32 g/dL (ref 30.0–36.0)
MCV: 75.6 fL — ABNORMAL LOW (ref 80.0–100.0)
Monocytes Absolute: 1.1 10*3/uL — ABNORMAL HIGH (ref 0.1–1.0)
Monocytes Relative: 7 %
Neutro Abs: 11.6 10*3/uL — ABNORMAL HIGH (ref 1.7–7.7)
Neutrophils Relative %: 77 %
Platelets: 231 10*3/uL (ref 150–400)
RBC: 4.96 MIL/uL (ref 3.87–5.11)
RDW: 13.6 % (ref 11.5–15.5)
WBC: 15 10*3/uL — ABNORMAL HIGH (ref 4.0–10.5)
nRBC: 0 % (ref 0.0–0.2)

## 2021-11-24 LAB — URINALYSIS, COMPLETE (UACMP) WITH MICROSCOPIC
Bilirubin Urine: NEGATIVE
Glucose, UA: NEGATIVE mg/dL
Hgb urine dipstick: NEGATIVE
Ketones, ur: NEGATIVE mg/dL
Nitrite: NEGATIVE
Protein, ur: 30 mg/dL — AB
Specific Gravity, Urine: 1.035 — ABNORMAL HIGH (ref 1.005–1.030)
pH: 5 (ref 5.0–8.0)

## 2021-11-24 LAB — WET PREP, GENITAL
Clue Cells Wet Prep HPF POC: NONE SEEN
Sperm: NONE SEEN
Trich, Wet Prep: NONE SEEN
WBC, Wet Prep HPF POC: <10 (ref <10)
Yeast Wet Prep HPF POC: NONE SEEN

## 2021-11-24 LAB — FETAL FIBRONECTIN: Fetal Fibronectin: POSITIVE — AB

## 2021-11-24 MED ORDER — LACTATED RINGERS IV BOLUS
1000.0000 mL | Freq: Once | INTRAVENOUS | Status: AC
  Administered 2021-11-24: 1000 mL via INTRAVENOUS

## 2021-11-24 MED ORDER — HYDROXYZINE HCL 50 MG PO TABS: 50.0000 mg | ORAL_TABLET | Freq: Four times a day (QID) | ORAL | Status: DC | PRN

## 2021-11-24 MED ORDER — NIFEDIPINE 10 MG PO CAPS
30.0000 mg | ORAL_CAPSULE | Freq: Four times a day (QID) | ORAL | Status: DC
  Administered 2021-11-24: 30 mg via ORAL
  Filled 2021-11-24: qty 3

## 2021-11-24 MED ORDER — NIFEDIPINE 10 MG PO CAPS: 30.0000 mg | ORAL_CAPSULE | Freq: Once | ORAL | Status: DC

## 2021-11-24 NOTE — OB Triage Note (Signed)
Pt is a G2P0 at [redacted]w[redacted]d presenting to L&D triage c/o abdominal cramping that radiates to her pelvis. Pt is rating it a 6/10 and states it was unrelieved by tylenol. She states it comes and goes and is about every 45 minutes. Pt endorses increased frequency to pee and white vaginal discharge. Pt denies LOF and vaginal bleeding. +FM. VSS.

## 2021-11-24 NOTE — H&P (Addendum)
Obstetric H&P   Chief Complaint: cramping every 45 minutes   Prenatal Care Provider: [facility]  History of Present Illness: 25 y.o. G2P0010 [redacted]w[redacted]d by 01/22/2022, by Ultrasound presenting to L&D for abdominal cramping every 45 mins.  The cramping started around 2pm this afternoon, it occurs every 45 minutes and lasts for about 3 minutes at a time.  The cramp starts at her sides and moves towards the front of her abdomen, she feels her abdomen become tight. The pain has gotten worse over time. At rest her pain is a "5/10" with the cramp occurs it is a "6-7/10".  Endorses + FM denies LOF/VB.  She has had white discharge x 1 week, denies any vaginal odor or irritation. She has had regular bowel movements, yesterday she went three times and they were a little firm.  Denies N/V/ or fevers. Aside from needing  to urinate frequently at night, she denies urinary symptoms. She took Tylenol at 3 pm with no relief.  She did try to take a shower, this helped a little. She has had 3 32 oz cups of water today, has had a decreased appetite, but reports she is so worried that she is not able to eat.   Syndey's pregnancy is complicated by hx of PPROM with a loss at 18wks. Currently with a cervical insufficieny, her cervix measured 1.1cm on 5/2.  She has been on vaginal progesterone. Her last growth [place] on 5/30 showed and EFW of 11%. She received betamethasone x2 on May 1 and May 2.   Pregravid weight 67.1 kg Total Weight Gain 17.7 kg  pregnancy Problems (from 05/30/21 to present)     Problem Noted Resolved   Cervical insufficiency during pregnancy in second trimester, antepartum 09/30/2021 by Federico Flake, MD No   Overview Signed 09/30/2021  3:29 PM by Federico Flake, MD    Declined cerclage and opted for vaginal progesterone      Supervision of high risk pregnancy, antepartum 07/30/2021 by Federico Flake, MD No   Overview Addendum 11/18/2021  1:35 PM by Mirna Mires, CNM     Nursing  Staff Provider  Office Location  [facility] Dating  LMP/early [place]       Language   Anatomy US    Flu Vaccine   Genetic/Carrier Screen  NIPS:   LR female AFP:    Horizon:  TDaP Vaccine   11/17/2021 Hgb A1C or  GTT Early  Third trimester - high; 3 hr:one elevated-passed!  COVID Vaccine    LAB RESULTS   Rhogam   NA Blood Type B/Positive/-- (02/13 1518)   Baby Feeding Plan  breast Antibody Negative (02/13 1518)  Contraception  Rubella 2.65 (02/13 1518)  Circumcision  RPR Non Reactive (02/13 1518)   Pediatrician   HBsAg Negative (02/13 1518)   Support Person  HCVAb   Prenatal Classes  HIV Non Reactive (02/13 1518)     BTL Consent  GBS   (For PCN allergy, check sensitivities)   VBAC Consent  Pap Deferred to PP       DME Rx [ ]  BP cuff [ ]  Weight Scale Waterbirth  [ ]  Class [ ]  Consent [ ]  CNM visit  PHQ9 & GAD7 [  ] new OB [  ] 28 weeks  [  ] 36 weeks Induction  [ ]  Orders Entered [ ] Foley Y/N             Review of Systems: 10 point review of systems negative unless  otherwise noted in HPI  Past Medical History: Patient Active Problem List   Diagnosis Date Noted   Abdominal pain 11/24/2021   Vaginal discharge during pregnancy in second trimester 10/06/2021   Cervical insufficiency during pregnancy in second trimester, antepartum 09/30/2021    Declined cerclage and opted for vaginal progesterone    History of preterm premature rupture of membranes (PPROM) 07/30/2021   History of pregnancy loss in prior pregnancy, currently pregnant in second trimester 07/30/2021    q 2 wk CL    Supervision of high risk pregnancy, antepartum 07/30/2021     Nursing Staff Provider  Office Location  [facility] Dating  LMP/early [place]       Language   Anatomy US    Flu Vaccine   Genetic/Carrier Screen  NIPS:   LR female AFP:    Horizon:  TDaP Vaccine   11/17/2021 Hgb A1C or  GTT Early  Third trimester - high; 3 hr:one elevated-passed!  COVID Vaccine    LAB RESULTS   Rhogam   NA Blood Type B/Positive/--  (02/13 1518)   Baby Feeding Plan  breast Antibody Negative (02/13 1518)  Contraception  Rubella 2.65 (02/13 1518)  Circumcision  RPR Non Reactive (02/13 1518)   Pediatrician   HBsAg Negative (02/13 1518)   Support Person  HCVAb   Prenatal Classes  HIV Non Reactive (02/13 1518)     BTL Consent  GBS   (For PCN allergy, check sensitivities)   VBAC Consent  Pap Deferred to PP       DME Rx [ ]  BP cuff [ ]  Weight Scale Waterbirth  [ ]  Class [ ]  Consent [ ]  CNM visit  PHQ9 & GAD7 [  ] new OB [  ] 28 weeks  [  ] 36 weeks Induction  [ ]  Orders Entered [ ] Foley Y/N      Nausea & vomiting 06/10/2021   Hyperemesis 06/10/2021    Past Surgical History: Past Surgical History:  Procedure Laterality Date   ADENOIDECTOMY     DILATION AND CURETTAGE OF UTERUS      Past Obstetric History: # 1 - Date: 2021, Sex: None, Weight: None, GA: [redacted]w[redacted]d, Delivery: Vaginal, Spontaneous, Apgar1: None, Apgar5: None, Living: Fetal Demise, Birth Comments: None  # 2 - Date: None, Sex: None, Weight: None, GA: None, Delivery: None, Apgar1: None, Apgar5: None, Living: None, Birth Comments: None   Past Gynecologic History:  Family History: Family History  Problem Relation Age of Onset   Hypertension Father    Diabetes Maternal Grandmother    Diabetes Paternal Grandmother     Social History: Social History   Socioeconomic History   Marital status: Single    Spouse name:   Number of children: Not on file   Years of education: Not on file   Highest education level: Not on file  Occupational History   Not on file  Tobacco Use   Smoking status: Former   Smokeless tobacco: Never  Vaping Use   Vaping Use: Former   Quit date: 05/01/2021  Substance and Sexual Activity   Alcohol use: No   Drug use: Never   Sexual activity: Yes    Partners: Male    Birth control/protection: I.U.D.  Other Topics Concern   Not on file  Social History Narrative   Not on file   Social Determinants of Health    Financial Resource Strain: Not on file  Food Insecurity: Not on file  Transportation Needs: Not on file  Physical  Activity: Not on file  Stress: Not on file  Social Connections: Not on file  Intimate Partner Violence: Not on file    Medications: Prior to Admission medications   Medication Sig Start Date End Date Taking? Authorizing Provider  Prenatal Vit-Fe Fumarate-FA (PRENATAL MULTIVITAMIN) TABS tablet Take 1 tablet by mouth daily at 12 noon.   Yes [provider]  progesterone (PROMETRIUM) 200 MG capsule Place 1 capsule (200 mg total) vaginally at bedtime. 09/23/21  Yes Noralee Space, MD  betamethasone acetate-betamethasone sodium phosphate (CELESTONE) 6 (3-3) MG/ML injection Inject 2 mLs (12 mg total) into the muscle every 24 hours x 2 doses. Patient not taking: Reported on 10/28/2021 10/07/21   Mirna Mires, CNM  Doxylamine-Pyridoxine (DICLEGIS) 10-10 MG TBEC Take 10 mg by mouth in the morning, at noon, and at bedtime. Patient not taking: Reported on 09/23/2021 09/11/21   Nadara Mustard, MD  metoCLOPramide (REGLAN) 10 MG tablet Take 1 tablet (10 mg total) by mouth 3 (three) times daily before meals. Patient not taking: Reported on 09/23/2021 08/11/21 11/09/21  Nadara Mustard, MD    Allergies: No Known Allergies  Physical Exam: Vitals: Blood pressure 122/73, pulse (!) 102, temperature 98.2 F (36.8 C), temperature source Oral, resp. rate 18, height 5\' 4"  (1.626 m), weight 84.8 kg, last menstrual period 03/24/2021.    FHT: baseline 155, moderate variability, pos accel, neg decel  Toco: irritability   General: Anxious  HEENT: normocephalic, anicteric Pulmonary: No increased work of breathing Cardiovascular: RRR, distal pulses 2+ Abdomen: Gravid, tender in lower right and left quadrants, no Suprapubic tenderness.  Le[place]: Genitourinary: SSE cervix difficult to fully visualize, small amount of physiologic discharge present  SVE 2.5/50/-2 Extremities: no edema,  erythema, or tenderness Neurologic: Grossly intact Psychiatric: mood appropriate, affect full  Labs: Results for orders placed or performed during the hospital encounter of 11/24/21 (from the past 24 hour(s))  Urinalysis, Complete w Microscopic Urine, Clean Catch     Status: Abnormal   Collection Time: 11/24/21  9:07 PM  Result Value Ref Range   Color, Urine YELLOW (A) YELLOW   APPearance CLOUDY (A) CLEAR   Specific Gravity, Urine 1.035 (H) 1.005 - 1.030   pH 5.0 5.0 - 8.0   Glucose, UA NEGATIVE NEGATIVE mg/dL   Hgb urine dipstick NEGATIVE NEGATIVE   Bilirubin Urine NEGATIVE NEGATIVE   Ketones, ur NEGATIVE NEGATIVE mg/dL   Protein, ur 30 (A) NEGATIVE mg/dL   Nitrite NEGATIVE NEGATIVE   Leukocytes,Ua TRACE (A) NEGATIVE   RBC / HPF 0-5 0 - 5 RBC/hpf   WBC, UA 6-10 0 - 5 WBC/hpf   Bacteria, UA RARE (A) NONE SEEN   Squamous Epithelial / LPF 21-50 0 - 5   Mucus PRESENT    Ca Oxalate Crys, UA PRESENT   Wet prep, genital     Status: None   Collection Time: 11/24/21  9:28 PM   Specimen: Cervix  Result Value Ref Range   Yeast Wet Prep HPF POC NONE SEEN NONE SEEN   Trich, Wet Prep NONE SEEN NONE SEEN   Clue Cells Wet Prep HPF POC NONE SEEN NONE SEEN   WBC, Wet Prep HPF POC <10 <10   Sperm NONE SEEN   Fetal fibronectin     Status: Abnormal   Collection Time: 11/24/21  9:28 PM  Result Value Ref Range   Fetal Fibronectin POSITIVE (A) NEGATIVE  CBC with Differential/Platelet     Status: Abnormal   Collection Time: 11/24/21  9:58 PM  Result Value Ref Range   WBC 15.0 (H) 4.0 - 10.5 K/uL   RBC 4.96 3.87 - 5.11 MIL/uL   Hemoglobin 12.0 12.0 - 15.0 g/dL   HCT 11.9 14.7 - 82.9 %   MCV 75.6 (L) 80.0 - 100.0 fL   MCH 24.2 (L) 26.0 - 34.0 pg   MCHC 32.0 30.0 - 36.0 g/dL   RDW 56.2 13.0 - 86.5 %   Platelets 231 150 - 400 K/uL   nRBC 0.0 0.0 - 0.2 %   Neutrophils Relative % 77 %   Neutro Abs 11.6 (H) 1.7 - 7.7 K/uL   Lymphocytes Relative 14 %   Lymphs Abs 2.2 0.7 - 4.0 K/uL    Monocytes Relative 7 %   Monocytes Absolute 1.1 (H) 0.1 - 1.0 K/uL   Eosinophils Relative 1 %   Eosinophils Absolute 0.1 0.0 - 0.5 K/uL   Basophils Relative 0 %   Basophils Absolute 0.1 0.0 - 0.1 K/uL   Immature Granulocytes 1 %   Abs Immature Granulocytes 0.08 (H) 0.00 - 0.07 K/uL    Assessment: 26 y.o. G2P0010 [redacted]w[redacted]d by 01/22/2022, by Ultrasound with threatened preterm labor  Plan: 1)Dr Logan Bores called reviewed plan of care IV fluid bolus, Procardia 30mg  now.Atarax PRN.  Will reassess in about 2 hours.    2) Fetus -category 1 tracing   3) PNL - Blood type B/Positive/-- (02/13 1518) / Anti-bodyscreen Negative (02/13 1518) / Rubella 2.65 (02/13 1518) / Varicella Immune / RPR Non Reactive (06/06 03-31-1993) / HBsAg Negative (02/13 1518) / HIV Non Reactive (06/06 0928) / 1-hr OGTT 155, normal three hour / GBS  Unknown   4) Immunization History -  Immunization History  Administered Date(s) Administered   Tdap 11/27/2017, 11/17/2021    5) Disposition - consider discharge home if no cervical change and symptoms improve.   01/17/2022 CNM  [facility], [facility] 11/24/2021, 10:37 PM

## 2021-11-25 ENCOUNTER — Other Ambulatory Visit: Payer: Self-pay

## 2021-11-25 ENCOUNTER — Ambulatory Visit (HOSPITAL_BASED_OUTPATIENT_CLINIC_OR_DEPARTMENT_OTHER): Payer: Medicaid Other

## 2021-11-25 DIAGNOSIS — O3433 Maternal care for cervical incompetence, third trimester: Secondary | ICD-10-CM | POA: Insufficient documentation

## 2021-11-25 DIAGNOSIS — Z8759 Personal history of other complications of pregnancy, childbirth and the puerperium: Secondary | ICD-10-CM

## 2021-11-25 DIAGNOSIS — F17210 Nicotine dependence, cigarettes, uncomplicated: Secondary | ICD-10-CM | POA: Insufficient documentation

## 2021-11-25 DIAGNOSIS — Z3A31 31 weeks gestation of pregnancy: Secondary | ICD-10-CM

## 2021-11-25 DIAGNOSIS — R1030 Lower abdominal pain, unspecified: Secondary | ICD-10-CM

## 2021-11-25 DIAGNOSIS — O09293 Supervision of pregnancy with other poor reproductive or obstetric history, third trimester: Secondary | ICD-10-CM | POA: Diagnosis not present

## 2021-11-25 DIAGNOSIS — O09213 Supervision of pregnancy with history of pre-term labor, third trimester: Secondary | ICD-10-CM | POA: Diagnosis not present

## 2021-11-25 DIAGNOSIS — O26893 Other specified pregnancy related conditions, third trimester: Principal | ICD-10-CM

## 2021-11-25 DIAGNOSIS — O99333 Smoking (tobacco) complicating pregnancy, third trimester: Secondary | ICD-10-CM

## 2021-11-25 DIAGNOSIS — O0993 Supervision of high risk pregnancy, unspecified, third trimester: Secondary | ICD-10-CM | POA: Insufficient documentation

## 2021-11-25 DIAGNOSIS — O0990 Supervision of high risk pregnancy, unspecified, unspecified trimester: Secondary | ICD-10-CM

## 2021-11-25 LAB — SAMPLE TO BLOOD BANK

## 2021-11-25 LAB — GROUP B STREP BY PCR: Group B strep by PCR: NEGATIVE

## 2021-11-25 NOTE — OB Triage Note (Signed)
Pt discharged home in stable condition per CNM. Labor precautions reviewed and pt/significant other verbalize understanding.

## 2021-11-25 NOTE — Discharge Summary (Cosign Needed)
Physician Final Progress Note  Patient ID: Sylvia Parks MRN: [id number] DOB/AGE: 01-Mar-1996 26 y.o.  Admit date: 11/24/2021 Admitting provider: Linzie Collin, MD Discharge date: 11/25/2021   Admission Diagnoses:  1) intrauterine pregnancy at [redacted]w[redacted]d  2) cramping   Discharge Diagnoses:  Principal Problem:   Abdominal pain    History of Present Illness: The patient is a 26 y.o. female G2P0010 at [redacted]w[redacted]d who presents for cramping every 45 since 2pm. The cramping lasts about 3 minutes at a time.  he cramp starts at her sides and moves towards the front of her abdomen, she feels her abdomen become tight. The pain has gotten worse over time. At rest her pain is a "5/10" with the cramp occurs it is a "6-7/10".  Endorses + FM denies LOF/VB.  She has had white discharge x 1 week, denies any vaginal odor or irritation. She has had regular bowel movements, yesterday she went three times and they were a little firm.  Denies N/V/ or fevers. Aside from needing  to urinate frequently at night, she denies urinary symptoms. She took Tylenol at 3 pm with no relief.  She did try to take a shower, this helped a little. She has had 3 32 oz cups of water today, has had a decreased appetite, but reports she is so worried that she is not able to eat.  Pt reports lately she has been busy getting ready for a move, but today she has not done too much.    Syndey's pregnancy is complicated by hx of PPROM with a loss at 18wks. Currently with a cervical insufficieny, her cervix measured 1.1cm on 5/2.  She has been on vaginal progesterone. Her last growth [place] on 5/30 showed and EFW of 11%. She received betamethasone x2 on May 1 and May 2.   Past Medical History:  Diagnosis Date   Endometriosis    Hypertension     Past Surgical History:  Procedure Laterality Date   ADENOIDECTOMY     DILATION AND CURETTAGE OF UTERUS      No current facility-administered medications on file prior to encounter.   Current  Outpatient Medications on File Prior to Encounter  Medication Sig Dispense Refill   Prenatal Vit-Fe Fumarate-FA (PRENATAL MULTIVITAMIN) TABS tablet Take 1 tablet by mouth daily at 12 noon.     progesterone (PROMETRIUM) 200 MG capsule Place 1 capsule (200 mg total) vaginally at bedtime. 30 capsule 3   betamethasone acetate-betamethasone sodium phosphate (CELESTONE) 6 (3-3) MG/ML injection Inject 2 mLs (12 mg total) into the muscle every 24 hours x 2 doses. (Patient not taking: Reported on 10/28/2021) 5 mL 1   Doxylamine-Pyridoxine (DICLEGIS) 10-10 MG TBEC Take 10 mg by mouth in the morning, at noon, and at bedtime. (Patient not taking: Reported on 09/23/2021) 60 tablet 0   metoCLOPramide (REGLAN) 10 MG tablet Take 1 tablet (10 mg total) by mouth 3 (three) times daily before meals. (Patient not taking: Reported on 09/23/2021) 90 tablet 2    No Known Allergies  Social History   Socioeconomic History   Marital status: Single    Spouse name: Gregary Signs   Number of children: Not on file   Years of education: Not on file   Highest education level: Not on file  Occupational History   Not on file  Tobacco Use   Smoking status: Former   Smokeless tobacco: Never  Vaping Use   Vaping Use: Former   Quit date: 05/01/2021  Substance and Sexual Activity  Alcohol use: No   Drug use: Never   Sexual activity: Yes    Partners: Male    Birth control/protection: I.U.D.  Other Topics Concern   Not on file  Social History Narrative   Not on file   Social Determinants of Health   Financial Resource Strain: Not on file  Food Insecurity: Not on file  Transportation Needs: Not on file  Physical Activity: Not on file  Stress: Not on file  Social Connections: Not on file  Intimate Partner Violence: Not on file    Family History  Problem Relation Age of Onset   Hypertension Father    Diabetes Maternal Grandmother    Diabetes Paternal Grandmother      Review of Systems  Constitutional: Negative.    Respiratory: Negative.    Cardiovascular: Negative.   Gastrointestinal:  Positive for abdominal pain. Negative for constipation, diarrhea, heartburn, nausea and vomiting.  Genitourinary:  Positive for frequency. Negative for dysuria, flank pain and urgency.  Neurological: Negative.   Psychiatric/Behavioral:  The patient is nervous/anxious.      Physical Exam: BP 115/69   Pulse 75   Temp 98.2 F (36.8 C) (Oral)   Resp 18   Ht 5\' 4"  (1.626 m)   Wt 84.8 kg   LMP 03/24/2021   BMI 32.10 kg/m   Physical Exam Constitutional:      Appearance: Normal appearance.  Genitourinary:     Vulva normal.     Genitourinary Comments: SVE 3/60/-2, bloody show on glove  Cardiovascular:     Rate and Rhythm: Normal rate.  Pulmonary:     Effort: Pulmonary effort is normal.  Abdominal:     Comments: Gravid, soft, tender in lower right and left quadrants  Musculoskeletal:        General: Normal range of motion.     Cervical back: Normal range of motion.     Right lower leg: No edema.     Left lower leg: No edema.  Neurological:     General: No focal deficit present.     Mental Status: She is alert and oriented to person, place, and time.  Skin:    General: Skin is warm.    EFM: baseline 155, moderate variability, pos accel, neg decel TOCO: rare contractions   Consults: None  Significant Findings/ Diagnostic Studies: FFN Positive, CBC    Component Value Date/Time   WBC 15.0 (H) 11/24/2021 2158   RBC 4.96 11/24/2021 2158   HGB 12.0 11/24/2021 2158   HGB 12.0 11/11/2021 0928   HCT 37.5 11/24/2021 2158   HCT 36.2 11/11/2021 0928   PLT 231 11/24/2021 2158   PLT 197 11/11/2021 0928   MCV 75.6 (L) 11/24/2021 2158   MCV 77 (L) 11/11/2021 0928   MCV 86 03/12/2012 0256   MCH 24.2 (L) 11/24/2021 2158   MCHC 32.0 11/24/2021 2158   RDW 13.6 11/24/2021 2158   RDW 13.0 11/11/2021 0928   RDW 13.3 03/12/2012 0256   LYMPHSABS 2.2 11/24/2021 2158   LYMPHSABS 1.5 11/11/2021 0928   MONOABS 1.1  (H) 11/24/2021 2158   EOSABS 0.1 11/24/2021 2158   EOSABS 0.1 11/11/2021 0928   BASOSABS 0.1 11/24/2021 2158   BASOSABS 0.1 11/11/2021 0928    Wet prep and UA negative   Procedures: RNST   Hospital Course: The patient was admitted to Labor and Delivery Triage for observation. She was given 1 liter LR bolus, 30mg  Procardia.  She was able to eat 01/11/2022 out.  Over time she  reported feeling the cramping less. Her repeat cervical had a subtle change, however the pt was more relaxed and the exam was easier to complete. Discussed this subtle change may not be significant. Bloody show was present on the this examiner's glove after the second exam, discussed this could be normal. Offered pt to remain in observation and be given Ambien for sleep with a plan to be discharged later this morning or to go home now.  Pt desires to be discharged now. Verbalized understanding of when to come back. Dr Logan Bores called and plan of care reviewed.   GBS swab sent  Discharge Condition: stable  Disposition: Discharge disposition: 01-Home or Self Care     Keep apt with MFM today at 1300 Keep ROB with Dr Okey Dupre on 6/26   Diet: Regular diet  Discharge Activity: Activity as tolerated, but to defer all moving activities to her partner and their friends,    Allergies as of 11/25/2021   No Known Allergies      Medication List     STOP taking these medications    betamethasone acetate-betamethasone sodium phosphate 6 (3-3) MG/ML injection Commonly known as: CELESTONE   Doxylamine-Pyridoxine 10-10 MG Tbec Commonly known as: Diclegis   metoCLOPramide 10 MG tablet Commonly known as: REGLAN       TAKE these medications    prenatal multivitamin Tabs tablet Take 1 tablet by mouth daily at 12 noon.   progesterone 200 MG capsule Commonly known as: PROMETRIUM Place 1 capsule (200 mg total) vaginally at bedtime.           SignedEllouise Newer Eydan Chianese, CNM  11/25/2021, 1:02 AM

## 2021-12-01 ENCOUNTER — Ambulatory Visit (INDEPENDENT_AMBULATORY_CARE_PROVIDER_SITE_OTHER): Payer: Medicaid Other | Admitting: Obstetrics

## 2021-12-01 VITALS — BP 110/60 | Wt 186.0 lb

## 2021-12-01 DIAGNOSIS — O09292 Supervision of pregnancy with other poor reproductive or obstetric history, second trimester: Secondary | ICD-10-CM

## 2021-12-01 DIAGNOSIS — O0990 Supervision of high risk pregnancy, unspecified, unspecified trimester: Secondary | ICD-10-CM

## 2021-12-01 DIAGNOSIS — O99330 Smoking (tobacco) complicating pregnancy, unspecified trimester: Secondary | ICD-10-CM

## 2021-12-01 DIAGNOSIS — O3432 Maternal care for cervical incompetence, second trimester: Secondary | ICD-10-CM

## 2021-12-01 DIAGNOSIS — Z3A32 32 weeks gestation of pregnancy: Secondary | ICD-10-CM

## 2021-12-01 DIAGNOSIS — Z8759 Personal history of other complications of pregnancy, childbirth and the puerperium: Secondary | ICD-10-CM

## 2021-12-01 LAB — POCT URINALYSIS DIPSTICK OB
Glucose, UA: NEGATIVE
POC,PROTEIN,UA: NEGATIVE

## 2021-12-01 NOTE — Progress Notes (Signed)
ROB- lost mucus plug last Friday, currently having lower back pain

## 2021-12-02 ENCOUNTER — Ambulatory Visit (INDEPENDENT_AMBULATORY_CARE_PROVIDER_SITE_OTHER): Payer: Medicaid Other

## 2021-12-02 ENCOUNTER — Encounter: Payer: Self-pay | Admitting: Licensed Practical Nurse

## 2021-12-02 ENCOUNTER — Other Ambulatory Visit: Payer: Self-pay | Admitting: Licensed Practical Nurse

## 2021-12-02 VITALS — Ht 64.0 in

## 2021-12-02 DIAGNOSIS — O09292 Supervision of pregnancy with other poor reproductive or obstetric history, second trimester: Secondary | ICD-10-CM

## 2021-12-02 DIAGNOSIS — O3432 Maternal care for cervical incompetence, second trimester: Secondary | ICD-10-CM

## 2021-12-02 DIAGNOSIS — O0990 Supervision of high risk pregnancy, unspecified, unspecified trimester: Secondary | ICD-10-CM | POA: Diagnosis not present

## 2021-12-02 DIAGNOSIS — Z3A32 32 weeks gestation of pregnancy: Secondary | ICD-10-CM

## 2021-12-02 DIAGNOSIS — O99891 Other specified diseases and conditions complicating pregnancy: Secondary | ICD-10-CM

## 2021-12-02 DIAGNOSIS — M549 Dorsalgia, unspecified: Secondary | ICD-10-CM

## 2021-12-02 DIAGNOSIS — Z8759 Personal history of other complications of pregnancy, childbirth and the puerperium: Secondary | ICD-10-CM

## 2021-12-02 NOTE — Progress Notes (Signed)
Patient here for NST per Dr. Okey Dupre Orders. Carie Caddy reviewed and discussed results with patient.

## 2021-12-02 NOTE — Progress Notes (Signed)
Pt asked to speak with this CNM.  She is here today for an NST.  Was told by another provider in the office  that she needs to have twice weekly NSTs because her baby is small.  The pt spoke with MFM, they told her her "baby is fine" but should have another growth [place], which is scheduled. Pt is upset as she feels the other provider was trying to tell her something is wrong with her baby but MMF and her other OB providers are telling her she is fine.  Asking if she really needs twice weekly NSTs starting now at [facility].   Discussed that EFW of 11% with AC in 25% is small but not IUGR.  We could consider fetal surveillance later in the pregnancy given her history of a loss at 18 wks, if she likes.  Pt desires to start weekly NST;s at 35 weeks Will keep next growth [place], understands plans may change based on future ultrasounds. Above plan discussed with Dr Logan Bores, in agreement that pt does not need twice weekly nsts starting now, she does need to have a repeat growth [place].  Also reviewed comfort measures for low back pain in pregnancy, will try home stretches, massage and heat. Referral for PT placed.   RNST baseline 140, moderate variability, poss accel (15x15) neg decel TOCO: no ctx's   Carie Caddy, CNM  [place] Pulse, [facility]  12/02/21  9:21 PM

## 2021-12-05 ENCOUNTER — Other Ambulatory Visit: Payer: Medicaid Other

## 2021-12-08 ENCOUNTER — Other Ambulatory Visit: Payer: Medicaid Other

## 2021-12-11 ENCOUNTER — Other Ambulatory Visit: Payer: Medicaid Other

## 2021-12-16 ENCOUNTER — Encounter: Payer: Medicaid Other | Admitting: Obstetrics & Gynecology

## 2021-12-16 ENCOUNTER — Encounter: Payer: Self-pay | Admitting: Advanced Practice Midwife

## 2021-12-16 ENCOUNTER — Ambulatory Visit (INDEPENDENT_AMBULATORY_CARE_PROVIDER_SITE_OTHER): Payer: Medicaid Other | Admitting: Advanced Practice Midwife

## 2021-12-16 ENCOUNTER — Other Ambulatory Visit: Payer: Medicaid Other

## 2021-12-16 ENCOUNTER — Other Ambulatory Visit: Payer: Self-pay

## 2021-12-16 VITALS — BP 120/80 | Wt 187.0 lb

## 2021-12-16 DIAGNOSIS — O3433 Maternal care for cervical incompetence, third trimester: Secondary | ICD-10-CM

## 2021-12-16 DIAGNOSIS — Z369 Encounter for antenatal screening, unspecified: Secondary | ICD-10-CM

## 2021-12-16 DIAGNOSIS — O3432 Maternal care for cervical incompetence, second trimester: Secondary | ICD-10-CM

## 2021-12-16 DIAGNOSIS — O0993 Supervision of high risk pregnancy, unspecified, third trimester: Secondary | ICD-10-CM

## 2021-12-16 DIAGNOSIS — O09293 Supervision of pregnancy with other poor reproductive or obstetric history, third trimester: Secondary | ICD-10-CM

## 2021-12-16 DIAGNOSIS — Z3A34 34 weeks gestation of pregnancy: Secondary | ICD-10-CM

## 2021-12-16 DIAGNOSIS — Z8759 Personal history of other complications of pregnancy, childbirth and the puerperium: Secondary | ICD-10-CM

## 2021-12-16 DIAGNOSIS — O09292 Supervision of pregnancy with other poor reproductive or obstetric history, second trimester: Secondary | ICD-10-CM

## 2021-12-16 DIAGNOSIS — O26873 Cervical shortening, third trimester: Secondary | ICD-10-CM

## 2021-12-16 DIAGNOSIS — O0990 Supervision of high risk pregnancy, unspecified, unspecified trimester: Secondary | ICD-10-CM

## 2021-12-16 LAB — POCT URINALYSIS DIPSTICK OB
Glucose, UA: NEGATIVE
POC,PROTEIN,UA: NEGATIVE

## 2021-12-16 NOTE — Progress Notes (Signed)
Routine Prenatal Care Visit  Subjective  Sylvia Parks is a 26 y.o. G2P0010 at [redacted]w[redacted]d being seen today for ongoing prenatal care.  She is currently monitored for the following issues for this high-risk pregnancy and has Nausea & vomiting; Hyperemesis; History of preterm premature rupture of membranes (PPROM); History of pregnancy loss in prior pregnancy, currently pregnant in second trimester; Supervision of high risk pregnancy, antepartum; Cervical insufficiency during pregnancy in second trimester, antepartum; and Abdominal pain on their problem list.  ----------------------------------------------------------------------------------- Patient reports no complaints. She has a growth scan scheduled for Thursday of this week.   Contractions: Not present. Vag. Bleeding: None.  Movement: Present. Leaking Fluid denies.  ----------------------------------------------------------------------------------- The following portions of the patient's history were reviewed and updated as appropriate: allergies, current medications, past family history, past medical history, past social history, past surgical history and problem list. Problem list updated.  Objective  Blood pressure 120/80, weight 187 lb (84.8 kg), last menstrual period 03/24/2021. Pregravid weight 148 lb (67.1 kg) Total Weight Gain 39 lb (17.7 kg) Urinalysis: Urine Protein    Urine Glucose    Fetal Status: Fetal Heart Rate (bpm): 150 Fundal Height: 35 cm Movement: Present      NST: reactive 20 minute tracing, 150 bpm, moderate variability, +accelerations, -decelerations  General:  Alert, oriented and cooperative. Patient is in no acute distress.  Skin: Skin is warm and dry. No rash noted.   Cardiovascular: Normal heart rate noted  Respiratory: Normal respiratory effort, no problems with respiration noted  Abdomen: Soft, gravid, appropriate for gestational age. Pain/Pressure: Absent     Pelvic:  Cervical exam deferred         Extremities: Normal range of motion.  Edema: None  Mental Status: Normal mood and affect. Normal behavior. Normal judgment and thought content.   Assessment   27 y.o. G2P0010 at [redacted]w[redacted]d by  01/22/2022, by Ultrasound presenting for routine prenatal visit  Plan   pregnancy Problems (from 05/30/21 to present)    Problem Noted Resolved   Cervical insufficiency during pregnancy in second trimester, antepartum 09/30/2021 by Federico Flake, MD No   Overview Signed 09/30/2021  3:29 PM by Federico Flake, MD    Declined cerclage and opted for vaginal progesterone      Supervision of high risk pregnancy, antepartum 07/30/2021 by Federico Flake, MD No   Overview Addendum 11/24/2021 11:16 PM by Ellwood Sayers, CNM     Nursing Staff Provider  Office Location  [facility] Dating  LMP/early [place]       Language   Anatomy US  normal  Flu Vaccine   Genetic/Carrier Screen  NIPS:   LR female AFP:    Horizon:  TDaP Vaccine   11/17/2021 Hgb A1C or  GTT Early  Third trimester - high; 3 hr:one elevated-passed!  COVID Vaccine    LAB RESULTS   Rhogam   NA Blood Type B/Positive/-- (02/13 1518)   Baby Feeding Plan  breast Antibody Negative (02/13 1518)  Contraception  Rubella 2.65 (02/13 1518)  Circumcision  RPR Non Reactive (02/13 1518)   Pediatrician   HBsAg Negative (02/13 1518)   Support Person  HCVAb   Prenatal Classes  HIV Non Reactive (02/13 1518)     BTL Consent  GBS   (For PCN allergy, check sensitivities)   VBAC Consent  Pap Deferred to PP       DME Rx [ ]  BP cuff [ ]  Weight Scale Waterbirth  [ ]  Class [ ]  Consent [ ]   CNM visit  PHQ9 & GAD7 [  ] new OB [  ] 28 weeks  [  ] 36 weeks Induction  [ ]  Orders Entered [ ] Foley Y/N             Preterm labor symptoms and general obstetric precautions including but not limited to vaginal bleeding, contractions, leaking of fluid and fetal movement were reviewed in detail with the patient. Please refer to After Visit Summary for  other counseling recommendations.   Return in about 1 week (around 12/23/2021) for nst and rob.  , CNM 12/16/2021 3:11 PM

## 2021-12-18 ENCOUNTER — Other Ambulatory Visit: Payer: Self-pay | Admitting: Obstetrics and Gynecology

## 2021-12-18 ENCOUNTER — Other Ambulatory Visit: Payer: Self-pay

## 2021-12-18 ENCOUNTER — Other Ambulatory Visit: Payer: Self-pay | Admitting: Maternal & Fetal Medicine

## 2021-12-18 ENCOUNTER — Ambulatory Visit: Payer: Medicaid Other | Attending: Maternal & Fetal Medicine

## 2021-12-18 DIAGNOSIS — Z8759 Personal history of other complications of pregnancy, childbirth and the puerperium: Secondary | ICD-10-CM | POA: Diagnosis not present

## 2021-12-18 DIAGNOSIS — O09293 Supervision of pregnancy with other poor reproductive or obstetric history, third trimester: Secondary | ICD-10-CM

## 2021-12-18 DIAGNOSIS — O0993 Supervision of high risk pregnancy, unspecified, third trimester: Secondary | ICD-10-CM | POA: Insufficient documentation

## 2021-12-18 DIAGNOSIS — O99333 Smoking (tobacco) complicating pregnancy, third trimester: Secondary | ICD-10-CM

## 2021-12-18 DIAGNOSIS — O3432 Maternal care for cervical incompetence, second trimester: Secondary | ICD-10-CM

## 2021-12-18 DIAGNOSIS — O26873 Cervical shortening, third trimester: Secondary | ICD-10-CM

## 2021-12-18 DIAGNOSIS — O3433 Maternal care for cervical incompetence, third trimester: Secondary | ICD-10-CM

## 2021-12-18 DIAGNOSIS — O0990 Supervision of high risk pregnancy, unspecified, unspecified trimester: Secondary | ICD-10-CM

## 2021-12-18 DIAGNOSIS — Z3A00 Weeks of gestation of pregnancy not specified: Secondary | ICD-10-CM | POA: Insufficient documentation

## 2021-12-18 DIAGNOSIS — O365930 Maternal care for other known or suspected poor fetal growth, third trimester, not applicable or unspecified: Secondary | ICD-10-CM | POA: Diagnosis not present

## 2021-12-18 DIAGNOSIS — F17210 Nicotine dependence, cigarettes, uncomplicated: Secondary | ICD-10-CM | POA: Diagnosis not present

## 2021-12-18 DIAGNOSIS — Z3A35 35 weeks gestation of pregnancy: Secondary | ICD-10-CM

## 2021-12-23 ENCOUNTER — Encounter: Payer: Self-pay | Admitting: Advanced Practice Midwife

## 2021-12-25 ENCOUNTER — Encounter: Payer: Self-pay | Admitting: Licensed Practical Nurse

## 2021-12-25 ENCOUNTER — Ambulatory Visit (INDEPENDENT_AMBULATORY_CARE_PROVIDER_SITE_OTHER): Payer: Medicaid Other | Admitting: Licensed Practical Nurse

## 2021-12-25 ENCOUNTER — Ambulatory Visit: Payer: Medicaid Other | Admitting: *Deleted

## 2021-12-25 ENCOUNTER — Ambulatory Visit: Payer: Medicaid Other | Attending: Obstetrics and Gynecology

## 2021-12-25 VITALS — BP 100/70 | Wt 189.0 lb

## 2021-12-25 VITALS — BP 134/76 | HR 92

## 2021-12-25 DIAGNOSIS — O0990 Supervision of high risk pregnancy, unspecified, unspecified trimester: Secondary | ICD-10-CM

## 2021-12-25 DIAGNOSIS — O26873 Cervical shortening, third trimester: Secondary | ICD-10-CM

## 2021-12-25 DIAGNOSIS — O0993 Supervision of high risk pregnancy, unspecified, third trimester: Secondary | ICD-10-CM

## 2021-12-25 DIAGNOSIS — O3432 Maternal care for cervical incompetence, second trimester: Secondary | ICD-10-CM | POA: Diagnosis present

## 2021-12-25 DIAGNOSIS — Z8759 Personal history of other complications of pregnancy, childbirth and the puerperium: Secondary | ICD-10-CM

## 2021-12-25 DIAGNOSIS — O3433 Maternal care for cervical incompetence, third trimester: Secondary | ICD-10-CM | POA: Diagnosis present

## 2021-12-25 DIAGNOSIS — Z3A36 36 weeks gestation of pregnancy: Secondary | ICD-10-CM

## 2021-12-25 DIAGNOSIS — O99333 Smoking (tobacco) complicating pregnancy, third trimester: Secondary | ICD-10-CM | POA: Diagnosis present

## 2021-12-25 LAB — POCT URINALYSIS DIPSTICK OB: Glucose, UA: NEGATIVE

## 2021-12-28 NOTE — Progress Notes (Signed)
Routine Prenatal Care Visit  Subjective  Sylvia Parks is a 26 y.o. G2P0010 at [redacted]w[redacted]d being seen today for ongoing prenatal care.  She is currently monitored for the following issues for this high-risk pregnancy and has Nausea & vomiting; Hyperemesis; History of preterm premature rupture of membranes (PPROM); History of pregnancy loss in prior pregnancy, currently pregnant in second trimester; Supervision of high risk pregnancy, antepartum; Cervical insufficiency during pregnancy in second trimester, antepartum; and Abdominal pain on their problem list.  ----------------------------------------------------------------------------------- Patient reports no complaints.  Here with family member.  Feeling pretty good, is pleased to have gotten to 36 weeks. Has MFM appointment later today, will plan birthing time based on today's visit.  Having some intense B-H contractions, but nothing regular.  Reviewed signs of labor.  Reviewed weight gain, pt not sure how she gained so much.  Contractions: Regular. Vag. Bleeding: None.  Movement: Present. Leaking Fluid denies.  ----------------------------------------------------------------------------------- The following portions of the patient's history were reviewed and updated as appropriate: allergies, current medications, past family history, past medical history, past social history, past surgical history and problem list. Problem list updated.  Objective  Blood pressure 100/70, weight 189 lb (85.7 kg), last menstrual period 03/24/2021. Pregravid weight 148 lb (67.1 kg) Total Weight Gain 41 lb (18.6 kg) Urinalysis: Urine Protein Small (1+)  Urine Glucose Negative  Fetal Status:     Movement: Present    Baseline 145, moderate variability, pos accel, neg decel  General:  Alert, oriented and cooperative. Patient is in no acute distress.  Skin: Skin is warm and dry. No rash noted.   Cardiovascular: Normal heart rate noted  Respiratory: Normal  respiratory effort, no problems with respiration noted  Abdomen: Soft, gravid, appropriate for gestational age. Pain/Pressure: Present     Pelvic:  Cervical exam deferred        Extremities: Normal range of motion.     Mental Status: Normal mood and affect. Normal behavior. Normal judgment and thought content.   Assessment   26 y.o. G2P0010 at [redacted]w[redacted]d by  01/22/2022, by Ultrasound presenting for routine prenatal visit  Plan   pregnancy Problems (from 05/30/21 to present)     Problem Noted Resolved   Cervical insufficiency during pregnancy in second trimester, antepartum 09/30/2021 by Federico Flake, MD No   Overview Signed 09/30/2021  3:29 PM by Federico Flake, MD    Declined cerclage and opted for vaginal progesterone      Supervision of high risk pregnancy, antepartum 07/30/2021 by Federico Flake, MD No   Overview Addendum 11/24/2021 11:16 PM by Ellwood Sayers, CNM     Nursing Staff Provider  Office Location  [facility] Dating  LMP/early [place]       Language   Anatomy US  normal  Flu Vaccine   Genetic/Carrier Screen  NIPS:   LR female AFP:    Horizon:  TDaP Vaccine   11/17/2021 Hgb A1C or  GTT Early  Third trimester - high; 3 hr:one elevated-passed!  COVID Vaccine    LAB RESULTS   Rhogam   NA Blood Type B/Positive/-- (02/13 1518)   Baby Feeding Plan  breast Antibody Negative (02/13 1518)  Contraception  Rubella 2.65 (02/13 1518)  Circumcision  RPR Non Reactive (02/13 1518)   Pediatrician   HBsAg Negative (02/13 1518)   Support Person  HCVAb   Prenatal Classes  HIV Non Reactive (02/13 1518)     BTL Consent  GBS   (For PCN allergy, check sensitivities)  VBAC Consent  Pap Deferred to PP       DME Rx [ ]  BP cuff [ ]  Weight Scale Waterbirth  [ ]  Class [ ]  Consent [ ]  CNM visit  PHQ9 & GAD7 [  ] new OB [  ] 28 weeks  [  ] 36 weeks Induction  [ ]  Orders Entered [ ] Foley Y/N              Preterm labor symptoms and general obstetric precautions including  but not limited to vaginal bleeding, contractions, leaking of fluid and fetal movement were reviewed in detail with the patient. Please refer to After Visit Summary for other counseling recommendations.   Return in about 1 week (around 01/01/2022) for ROB, NST.  MFM later today  If still pregnant at [redacted] weeks will need to repeat GBS.   , CNM  , [facility]  12/28/21  9:53 AM

## 2021-12-30 ENCOUNTER — Ambulatory Visit: Payer: Medicaid Other | Attending: Obstetrics and Gynecology

## 2021-12-30 ENCOUNTER — Other Ambulatory Visit: Payer: Self-pay

## 2021-12-30 VITALS — BP 133/87 | HR 82 | Temp 98.6°F | Ht 64.0 in | Wt 192.5 lb

## 2021-12-30 DIAGNOSIS — O365930 Maternal care for other known or suspected poor fetal growth, third trimester, not applicable or unspecified: Secondary | ICD-10-CM

## 2021-12-30 DIAGNOSIS — Z3A36 36 weeks gestation of pregnancy: Secondary | ICD-10-CM | POA: Diagnosis not present

## 2021-12-30 DIAGNOSIS — Z8759 Personal history of other complications of pregnancy, childbirth and the puerperium: Secondary | ICD-10-CM

## 2021-12-30 DIAGNOSIS — O26873 Cervical shortening, third trimester: Secondary | ICD-10-CM

## 2021-12-30 DIAGNOSIS — O3432 Maternal care for cervical incompetence, second trimester: Secondary | ICD-10-CM

## 2021-12-30 DIAGNOSIS — O0990 Supervision of high risk pregnancy, unspecified, unspecified trimester: Secondary | ICD-10-CM

## 2021-12-30 DIAGNOSIS — O3433 Maternal care for cervical incompetence, third trimester: Secondary | ICD-10-CM

## 2021-12-30 DIAGNOSIS — O99333 Smoking (tobacco) complicating pregnancy, third trimester: Secondary | ICD-10-CM

## 2021-12-31 ENCOUNTER — Observation Stay
Admission: EM | Admit: 2021-12-31 | Discharge: 2021-12-31 | Disposition: A | Discharge status: Home / Self Care | Payer: Medicaid Other | Attending: Obstetrics and Gynecology | Admitting: Obstetrics and Gynecology

## 2021-12-31 ENCOUNTER — Encounter: Payer: Self-pay | Admitting: Obstetrics and Gynecology

## 2021-12-31 DIAGNOSIS — R102 Pelvic and perineal pain: Secondary | ICD-10-CM | POA: Insufficient documentation

## 2021-12-31 DIAGNOSIS — R198 Other specified symptoms and signs involving the digestive system and abdomen: Secondary | ICD-10-CM | POA: Diagnosis present

## 2021-12-31 DIAGNOSIS — O3432 Maternal care for cervical incompetence, second trimester: Secondary | ICD-10-CM

## 2021-12-31 DIAGNOSIS — O26893 Other specified pregnancy related conditions, third trimester: Principal | ICD-10-CM | POA: Insufficient documentation

## 2021-12-31 DIAGNOSIS — Z3490 Encounter for supervision of normal pregnancy, unspecified, unspecified trimester: Secondary | ICD-10-CM

## 2021-12-31 DIAGNOSIS — O10913 Unspecified pre-existing hypertension complicating pregnancy, third trimester: Secondary | ICD-10-CM | POA: Diagnosis not present

## 2021-12-31 DIAGNOSIS — Z3A36 36 weeks gestation of pregnancy: Secondary | ICD-10-CM | POA: Insufficient documentation

## 2021-12-31 DIAGNOSIS — Z87891 Personal history of nicotine dependence: Secondary | ICD-10-CM | POA: Diagnosis not present

## 2021-12-31 DIAGNOSIS — R109 Unspecified abdominal pain: Secondary | ICD-10-CM

## 2021-12-31 DIAGNOSIS — O0990 Supervision of high risk pregnancy, unspecified, unspecified trimester: Secondary | ICD-10-CM

## 2021-12-31 LAB — URINALYSIS, ROUTINE W REFLEX MICROSCOPIC
Bilirubin Urine: NEGATIVE
Glucose, UA: NEGATIVE mg/dL
Hgb urine dipstick: NEGATIVE
Ketones, ur: NEGATIVE mg/dL
Nitrite: NEGATIVE
Protein, ur: 30 mg/dL — AB
Specific Gravity, Urine: 1.027 (ref 1.005–1.030)
pH: 5 (ref 5.0–8.0)

## 2021-12-31 MED ORDER — HYDROXYZINE HCL 25 MG PO TABS
50.0000 mg | ORAL_TABLET | Freq: Once | ORAL | Status: AC
Start: 2021-12-31 — End: 2021-12-31
  Administered 2021-12-31: 50 mg via ORAL
  Filled 2021-12-31: qty 2

## 2021-12-31 NOTE — Discharge Summary (Signed)
   Please see Final Progress Note.  Mirna Mires, CNM  12/31/2021 6:47 AM

## 2021-12-31 NOTE — OB Triage Note (Signed)
Pt is a G2 P0 with back pain since yesterday and pelvic pressure and burning upon urination. Pt brought to triage from ED via wheelchair.Pt denies bleeding or LOF at this time.

## 2021-12-31 NOTE — Final Progress Note (Signed)
Final Progress Note  Patient ID: Sylvia Parks MRN: [id number] DOB/AGE: 1996/04/24 26 y.o.  Admit date: 12/31/2021 Admitting provider: Linzie Collin, MD Discharge date: 12/31/2021   Admission Diagnoses: pelvic pain Cramping in pregnancy  Discharge Diagnoses:  Principal Problem:   Pregnant Active Problems:   Abdominal complaints  Reactive, reassuring fetal heart tones "Lightening pain" in third trimester  History of Present Illness: The patient is a 26 y.o. female G2P0010 at [redacted]w[redacted]d who presents for evaluation of some lower pelvic pain that has been bothering her since yesterday. She is a gravida 2 para 0 with a history of a fetal loss, who is now 36 weeks 6 days gestation.Marland Kitchen Her history is also significant or cervical insufficiency. She reports good fetal movement. Has had some mild cramping, no bleeding nor leaking of fluid. She admits to the triage nurse that , with her hx of loss, she is anxious about this pregnancy.  Past Medical History:  Diagnosis Date   Endometriosis    Hypertension     Past Surgical History:  Procedure Laterality Date   ADENOIDECTOMY     DILATION AND CURETTAGE OF UTERUS      No current facility-administered medications on file prior to encounter.   Current Outpatient Medications on File Prior to Encounter  Medication Sig Dispense Refill   Prenatal Vit-Fe Fumarate-FA (PRENATAL MULTIVITAMIN) TABS tablet Take 1 tablet by mouth daily at 12 noon.      No Known Allergies  Social History   Socioeconomic History   Marital status: Single    Spouse name: Gregary Signs   Number of children: Not on file   Years of education: Not on file   Highest education level: Not on file  Occupational History   Not on file  Tobacco Use   Smoking status: Former   Smokeless tobacco: Never  Vaping Use   Vaping Use: Former   Quit date: 05/01/2021  Substance and Sexual Activity   Alcohol use: No   Drug use: Never   Sexual activity: Yes    Partners: Male  Other  Topics Concern   Not on file  Social History Narrative   Not on file   Social Determinants of Health   Financial Resource Strain: Not on file  Food Insecurity: Not on file  Transportation Needs: Not on file  Physical Activity: Not on file  Stress: Not on file  Social Connections: Not on file  Intimate Partner Violence: Not on file    Family History  Problem Relation Age of Onset   Asthma Father    Hypertension Father    Diabetes Maternal Grandmother    Diabetes Paternal Grandmother    Birth defects Neg Hx    Cancer Neg Hx    Hearing loss Neg Hx    Stroke Neg Hx      Review of Systems  Constitutional: Negative.   HENT: Negative.    Eyes: Negative.   Respiratory: Negative.    Cardiovascular: Negative.   Gastrointestinal:        Gravida at 36 weeks 6 days gestation. Some occasional abdominal tightening.  Genitourinary: Negative.   Musculoskeletal: Negative.   Skin: Negative.   Neurological: Negative.   Endo/Heme/Allergies: Negative.   Psychiatric/Behavioral: Negative.       Physical Exam: BP 129/74   Pulse 82   Temp 98.5 F (36.9 C) (Oral)   Resp (!) 22   Ht 5\' 4"  (1.626 m)   Wt 87.1 kg   LMP 03/24/2021   BMI 32.96 kg/m  Physical Exam Genitourinary:     Genitourinary Comments: See cervical exam per the Triage RN. No contractions of regularity.Category 1 FHTs  Cardiovascular:     Rate and Rhythm: Normal rate and regular rhythm.  Pulmonary:     Effort: Pulmonary effort is normal.     Breath sounds: Normal breath sounds.  Psychiatric:        Mood and Affect: Mood normal.        Behavior: Behavior normal.  Vitals and nursing note reviewed.     Consults: None  Significant Findings/ Diagnostic Studies: labs:  Results for orders placed or performed during the hospital encounter of 12/31/21 (from the past 24 hour(s))  Urinalysis, Routine w reflex microscopic Urine, Clean Catch     Status: Abnormal   Collection Time: 12/31/21  3:45 AM  Result Value  Ref Range   Color, Urine YELLOW (A) YELLOW   APPearance HAZY (A) CLEAR   Specific Gravity, Urine 1.027 1.005 - 1.030   pH 5.0 5.0 - 8.0   Glucose, UA NEGATIVE NEGATIVE mg/dL   Hgb urine dipstick NEGATIVE NEGATIVE   Bilirubin Urine NEGATIVE NEGATIVE   Ketones, ur NEGATIVE NEGATIVE mg/dL   Protein, ur 30 (A) NEGATIVE mg/dL   Nitrite NEGATIVE NEGATIVE   Leukocytes,Ua MODERATE (A) NEGATIVE   RBC / HPF 0-5 0 - 5 RBC/hpf   WBC, UA 11-20 0 - 5 WBC/hpf   Bacteria, UA RARE (A) NONE SEEN   Squamous Epithelial / LPF 11-20 0 - 5   Mucus PRESENT      Procedures: EFM NST Baseline FHR: normal range beats/min Variability: moderate Accelerations: present Decelerations: absent Tocometry: no regular uterine contractions  Interpretation:  INDICATIONS: patient reassurance and rule out uterine contractions RESULTS:  A NST procedure was performed with FHR monitoring and a normal baseline established, appropriate time of 20-40 minutes of evaluation, and accels >2 seen w 15x15 characteristics.  Results show a REACTIVE NST.    Hospital Course: The patient was admitted to Labor and Delivery Triage for observation. A clean catch urine sample was sent to the lab. She was placed ont he fetal monitor which demonstrated a category 1 tracing. A cervical exam did not indicate change or labor.the patient was reassured by the EFM tracing, and was discharged home with  one vistaril to encourage rest. She will follow up this week at her next OB prenatal appointment.  Discharge Condition: good  Disposition: Discharge disposition: 01-Home or Self Care       Diet: Regular diet  Discharge Activity: Activity as tolerated  Discharge Instructions     Fetal Kick Count:  Lie on our left side for one hour after a meal, and count the number of times your baby kicks.  If it is less than 5 times, get up, move around and drink some juice.  Repeat the test 30 minutes later.  If it is still less than 5 kicks in an hour,  notify your doctor.   Complete by: As directed    LABOR:  When conractions begin, you should start to time them from the beginning of one contraction to the beginning  of the next.  When contractions are 5 - 10 minutes apart or less and have been regular for at least an hour, you should call your health care provider.   Complete by: As directed    Notify physician for bleeding from the vagina   Complete by: As directed    Notify physician for blurring of vision or spots before  the eyes   Complete by: As directed    Notify physician for chills or fever   Complete by: As directed    Notify physician for fainting spells, "black outs" or loss of consciousness   Complete by: As directed    Notify physician for increase in vaginal discharge   Complete by: As directed    Notify physician for leaking of fluid   Complete by: As directed    Notify physician for pain or burning when urinating   Complete by: As directed    Notify physician for pelvic pressure (sudden increase)   Complete by: As directed    Notify physician for severe or continued nausea or vomiting   Complete by: As directed    Notify physician for sudden gushing of fluid from the vagina (with or without continued leaking)   Complete by: As directed    Notify physician for sudden, constant, or occasional abdominal pain   Complete by: As directed    Notify physician if baby moving less than usual   Complete by: As directed       Allergies as of 12/31/2021   No Known Allergies      Medication List     TAKE these medications    prenatal multivitamin Tabs tablet Take 1 tablet by mouth daily at 12 noon.         Total time spent taking care of this patient: 25 minutes  Signed: Mirna Mires, CNM  12/31/2021, 7:10 AM

## 2022-01-02 ENCOUNTER — Ambulatory Visit (INDEPENDENT_AMBULATORY_CARE_PROVIDER_SITE_OTHER): Payer: Medicaid Other | Admitting: Licensed Practical Nurse

## 2022-01-02 ENCOUNTER — Other Ambulatory Visit: Payer: Medicaid Other

## 2022-01-02 ENCOUNTER — Encounter: Payer: Self-pay | Admitting: Licensed Practical Nurse

## 2022-01-02 VITALS — BP 118/72 | Wt 193.0 lb

## 2022-01-02 DIAGNOSIS — Z3A37 37 weeks gestation of pregnancy: Secondary | ICD-10-CM

## 2022-01-02 DIAGNOSIS — O0990 Supervision of high risk pregnancy, unspecified, unspecified trimester: Secondary | ICD-10-CM

## 2022-01-02 DIAGNOSIS — O09213 Supervision of pregnancy with history of pre-term labor, third trimester: Secondary | ICD-10-CM

## 2022-01-02 DIAGNOSIS — O09293 Supervision of pregnancy with other poor reproductive or obstetric history, third trimester: Secondary | ICD-10-CM

## 2022-01-02 LAB — POCT URINALYSIS DIPSTICK OB
Appearance: NORMAL
Bilirubin, UA: NEGATIVE
Blood, UA: NEGATIVE
Glucose, UA: NEGATIVE
Ketones, UA: NEGATIVE
Leukocytes, UA: NEGATIVE
Nitrite, UA: NEGATIVE
Odor: NORMAL
POC,PROTEIN,UA: NEGATIVE
Spec Grav, UA: 1.015 (ref 1.010–1.025)
Urobilinogen, UA: 0.2 E.U./dL
pH, UA: 6 (ref 5.0–8.0)

## 2022-01-02 NOTE — Progress Notes (Signed)
Routine Prenatal Care Visit  Subjective  Sylvia Parks is a 26 y.o. G2P0010 at [redacted]w[redacted]d being seen today for ongoing prenatal care.  She is currently monitored for the following issues for this high-risk pregnancy and has Nausea & vomiting; Hyperemesis; History of preterm premature rupture of membranes (PPROM); History of pregnancy loss in prior pregnancy, currently pregnant in second trimester; Supervision of high risk pregnancy, antepartum; Cervical insufficiency during pregnancy in second trimester, antepartum; Abdominal pain; Pregnant; and Abdominal complaints on their problem list.  ----------------------------------------------------------------------------------- Patient reports  sleep disturbances .  Here with partner.  Active fetus.  Ready to have this baby as she is feeling more uncomfortable. Having B-H contractions, nothing consistent  Contractions: Irregular. Vag. Bleeding: None.  Movement: Present. Leaking Fluid denies.  ----------------------------------------------------------------------------------- The following portions of the patient's history were reviewed and updated as appropriate: allergies, current medications, past family history, past medical history, past social history, past surgical history and problem list. Problem list updated.  Objective  Blood pressure 118/72, weight 193 lb (87.5 kg), last menstrual period 03/24/2021. Pregravid weight 148 lb (67.1 kg) Total Weight Gain 45 lb (20.4 kg) Urinalysis: Urine Protein Negative  Urine Glucose Negative  Fetal Status: Fetal Heart Rate (bpm): RNST   Movement: Present    Baseline 135, moderate variability, pos accel, neg decel  Toco:  irregular, mild, soft resting tone  General:  Alert, oriented and cooperative. Patient is in no acute distress.  Skin: Skin is warm and dry. No rash noted.   Cardiovascular: Normal heart rate noted  Respiratory: Normal respiratory effort, no problems with respiration noted  Abdomen: Soft,  gravid, appropriate for gestational age. Pain/Pressure: Present     Pelvic:  Cervical exam deferred        Extremities: Normal range of motion.  Edema: Trace  Mental Status: Normal mood and affect. Normal behavior. Normal judgment and thought content.   Assessment   26 y.o. G2P0010 at [redacted]w[redacted]d by  01/22/2022, by Ultrasound presenting for routine prenatal visit  Plan   pregnancy Problems (from 05/30/21 to present)     Problem Noted Resolved   Cervical insufficiency during pregnancy in second trimester, antepartum 09/30/2021 by Federico Flake, MD No   Overview Signed 09/30/2021  3:29 PM by Federico Flake, MD    Declined cerclage and opted for vaginal progesterone      Supervision of high risk pregnancy, antepartum 07/30/2021 by Federico Flake, MD No   Overview Addendum 11/24/2021 11:16 PM by Ellwood Sayers, CNM     Nursing Staff Provider  Office Location  [facility] Dating  LMP/early [place]       Language   Anatomy US  normal  Flu Vaccine   Genetic/Carrier Screen  NIPS:   LR female AFP:    Horizon:  TDaP Vaccine   11/17/2021 Hgb A1C or  GTT Early  Third trimester - high; 3 hr:one elevated-passed!  COVID Vaccine    LAB RESULTS   Rhogam   NA Blood Type B/Positive/-- (02/13 1518)   Baby Feeding Plan  breast Antibody Negative (02/13 1518)  Contraception  Rubella 2.65 (02/13 1518)  Circumcision  RPR Non Reactive (02/13 1518)   Pediatrician   HBsAg Negative (02/13 1518)   Support Person  HCVAb   Prenatal Classes  HIV Non Reactive (02/13 1518)     BTL Consent  GBS   (For PCN allergy, check sensitivities)   VBAC Consent  Pap Deferred to PP       DME Rx [ ]   BP cuff [ ]  Weight Scale Waterbirth  [ ]  Class [ ]  Consent [ ]  CNM visit  PHQ9 & GAD7 [  ] new OB [  ] 28 weeks  [  ] 36 weeks Induction  [ ]  Orders Entered [ ] Foley Y/N              Term labor symptoms and general obstetric precautions including but not limited to vaginal bleeding, contractions, leaking of  fluid and fetal movement were reviewed in detail with the patient. Please refer to After Visit Summary for other counseling recommendations.   Return in about 1 week (around 01/09/2022) for ROB, NST.  Will discuss scheduling IOL at next visit   , CNM  [facility]  01/02/22  10:29 AM

## 2022-01-06 ENCOUNTER — Other Ambulatory Visit: Payer: Self-pay

## 2022-01-06 DIAGNOSIS — O3433 Maternal care for cervical incompetence, third trimester: Secondary | ICD-10-CM

## 2022-01-06 DIAGNOSIS — Z8759 Personal history of other complications of pregnancy, childbirth and the puerperium: Secondary | ICD-10-CM

## 2022-01-06 DIAGNOSIS — O0990 Supervision of high risk pregnancy, unspecified, unspecified trimester: Secondary | ICD-10-CM

## 2022-01-06 DIAGNOSIS — O99333 Smoking (tobacco) complicating pregnancy, third trimester: Secondary | ICD-10-CM

## 2022-01-06 DIAGNOSIS — O26873 Cervical shortening, third trimester: Secondary | ICD-10-CM

## 2022-01-06 DIAGNOSIS — O09293 Supervision of pregnancy with other poor reproductive or obstetric history, third trimester: Secondary | ICD-10-CM

## 2022-01-08 ENCOUNTER — Encounter: Payer: Self-pay | Admitting: Obstetrics and Gynecology

## 2022-01-08 ENCOUNTER — Inpatient Hospital Stay
Admission: EM | Admit: 2022-01-08 | Discharge: 2022-01-10 | DRG: 807 | Disposition: A | Discharge status: Home / Self Care | Payer: Medicaid Other | Attending: Obstetrics | Admitting: Obstetrics

## 2022-01-08 ENCOUNTER — Ambulatory Visit (INDEPENDENT_AMBULATORY_CARE_PROVIDER_SITE_OTHER): Payer: Medicaid Other | Admitting: Obstetrics

## 2022-01-08 ENCOUNTER — Ambulatory Visit: Payer: Medicaid Other

## 2022-01-08 ENCOUNTER — Other Ambulatory Visit: Payer: Self-pay

## 2022-01-08 VITALS — BP 126/84 | Wt 194.0 lb

## 2022-01-08 DIAGNOSIS — O3432 Maternal care for cervical incompetence, second trimester: Secondary | ICD-10-CM

## 2022-01-08 DIAGNOSIS — O9081 Anemia of the puerperium: Secondary | ICD-10-CM | POA: Diagnosis not present

## 2022-01-08 DIAGNOSIS — O09213 Supervision of pregnancy with history of pre-term labor, third trimester: Secondary | ICD-10-CM | POA: Diagnosis not present

## 2022-01-08 DIAGNOSIS — O0990 Supervision of high risk pregnancy, unspecified, unspecified trimester: Principal | ICD-10-CM

## 2022-01-08 DIAGNOSIS — O365930 Maternal care for other known or suspected poor fetal growth, third trimester, not applicable or unspecified: Principal | ICD-10-CM | POA: Diagnosis present

## 2022-01-08 DIAGNOSIS — Z3A38 38 weeks gestation of pregnancy: Secondary | ICD-10-CM

## 2022-01-08 DIAGNOSIS — O26893 Other specified pregnancy related conditions, third trimester: Secondary | ICD-10-CM | POA: Diagnosis present

## 2022-01-08 LAB — CBC
HCT: 37.8 % (ref 36.0–46.0)
Hemoglobin: 11.7 g/dL — ABNORMAL LOW (ref 12.0–15.0)
MCH: 22.5 pg — ABNORMAL LOW (ref 26.0–34.0)
MCHC: 31 g/dL (ref 30.0–36.0)
MCV: 72.6 fL — ABNORMAL LOW (ref 80.0–100.0)
Platelets: 248 10*3/uL (ref 150–400)
RBC: 5.21 MIL/uL — ABNORMAL HIGH (ref 3.87–5.11)
RDW: 15.2 % (ref 11.5–15.5)
WBC: 16.5 10*3/uL — ABNORMAL HIGH (ref 4.0–10.5)
nRBC: 0 % (ref 0.0–0.2)

## 2022-01-08 LAB — TYPE AND SCREEN
ABO/RH(D): B POS
Antibody Screen: NEGATIVE

## 2022-01-08 LAB — GROUP B STREP BY PCR: Group B strep by PCR: NEGATIVE

## 2022-01-08 MED ORDER — OXYCODONE-ACETAMINOPHEN 5-325 MG PO TABS: 1.0000 | ORAL_TABLET | ORAL | Status: DC | PRN

## 2022-01-08 MED ORDER — METHYLERGONOVINE MALEATE 0.2 MG/ML IJ SOLN
INTRAMUSCULAR | Status: AC
  Filled 2022-01-08: qty 1

## 2022-01-08 MED ORDER — OXYTOCIN BOLUS FROM INFUSION
333.0000 mL | Freq: Once | INTRAVENOUS | Status: AC
  Administered 2022-01-08: 333 mL via INTRAVENOUS

## 2022-01-08 MED ORDER — CARBOPROST TROMETHAMINE 250 MCG/ML IM SOLN
INTRAMUSCULAR | Status: AC
  Filled 2022-01-08: qty 1

## 2022-01-08 MED ORDER — OXYTOCIN-SODIUM CHLORIDE 30-0.9 UT/500ML-% IV SOLN
2.5000 [IU]/h | INTRAVENOUS | Status: DC
  Administered 2022-01-08: 2.5 [IU]/h via INTRAVENOUS

## 2022-01-08 MED ORDER — LIDOCAINE HCL (PF) 1 % IJ SOLN
30.0000 mL | INTRAMUSCULAR | Status: AC | PRN
  Administered 2022-01-08: 30 mL via SUBCUTANEOUS

## 2022-01-08 MED ORDER — OXYTOCIN-SODIUM CHLORIDE 30-0.9 UT/500ML-% IV SOLN
1.0000 m[IU]/min | INTRAVENOUS | Status: DC
  Administered 2022-01-08: 2 m[IU]/min via INTRAVENOUS
  Filled 2022-01-08: qty 500

## 2022-01-08 MED ORDER — COCONUT OIL OIL: 1.0000 | TOPICAL_OIL | Status: DC | PRN

## 2022-01-08 MED ORDER — OXYTOCIN 10 UNIT/ML IJ SOLN: 10.0000 [IU] | Freq: Once | INTRAMUSCULAR | Status: DC

## 2022-01-08 MED ORDER — LACTATED RINGERS IV SOLN
INTRAVENOUS | Status: DC
  Administered 2022-01-08: 1000 mL via INTRAVENOUS

## 2022-01-08 MED ORDER — PRENATAL MULTIVITAMIN CH
1.0000 | ORAL_TABLET | Freq: Every day | ORAL | Status: DC
  Administered 2022-01-09 – 2022-01-10 (×2): 1 via ORAL
  Filled 2022-01-08 (×2): qty 1

## 2022-01-08 MED ORDER — FENTANYL CITRATE (PF) 100 MCG/2ML IJ SOLN
50.0000 ug | INTRAMUSCULAR | Status: DC | PRN
  Administered 2022-01-08: 100 ug via INTRAVENOUS
  Filled 2022-01-08: qty 2

## 2022-01-08 MED ORDER — DOCUSATE SODIUM 100 MG PO CAPS
100.0000 mg | ORAL_CAPSULE | Freq: Two times a day (BID) | ORAL | Status: DC
  Administered 2022-01-09 – 2022-01-10 (×4): 100 mg via ORAL
  Filled 2022-01-08 (×4): qty 1

## 2022-01-08 MED ORDER — IBUPROFEN 600 MG PO TABS
600.0000 mg | ORAL_TABLET | Freq: Four times a day (QID) | ORAL | Status: DC
  Administered 2022-01-09 – 2022-01-10 (×7): 600 mg via ORAL
  Filled 2022-01-08 (×7): qty 1

## 2022-01-08 MED ORDER — BENZOCAINE-MENTHOL 20-0.5 % EX AERO: 1.0000 | INHALATION_SPRAY | CUTANEOUS | Status: DC | PRN

## 2022-01-08 MED ORDER — METHYLERGONOVINE MALEATE 0.2 MG/ML IJ SOLN: 0.2000 mg | INTRAMUSCULAR | Status: DC | PRN

## 2022-01-08 MED ORDER — ACETAMINOPHEN 325 MG PO TABS: 650.0000 mg | ORAL_TABLET | ORAL | Status: DC | PRN

## 2022-01-08 MED ORDER — WITCH HAZEL-GLYCERIN EX PADS: MEDICATED_PAD | CUTANEOUS | Status: DC | PRN

## 2022-01-08 MED ORDER — LIDOCAINE HCL (PF) 1 % IJ SOLN
INTRAMUSCULAR | Status: AC
  Administered 2022-01-08: 30 mL
  Filled 2022-01-08: qty 30

## 2022-01-08 MED ORDER — MISOPROSTOL 200 MCG PO TABS
ORAL_TABLET | ORAL | Status: AC
  Filled 2022-01-08: qty 5

## 2022-01-08 MED ORDER — ACETAMINOPHEN 325 MG PO TABS
650.0000 mg | ORAL_TABLET | ORAL | Status: DC | PRN
  Administered 2022-01-09 – 2022-01-10 (×2): 650 mg via ORAL
  Filled 2022-01-08 (×2): qty 2

## 2022-01-08 MED ORDER — LACTATED RINGERS IV SOLN: 500.0000 mL | INTRAVENOUS | Status: DC | PRN

## 2022-01-08 MED ORDER — METHYLERGONOVINE MALEATE 0.2 MG PO TABS: 0.2000 mg | ORAL_TABLET | ORAL | Status: DC | PRN

## 2022-01-08 MED ORDER — DIPHENHYDRAMINE HCL 25 MG PO CAPS: 25.0000 mg | ORAL_CAPSULE | Freq: Four times a day (QID) | ORAL | Status: DC | PRN

## 2022-01-08 MED ORDER — SOD CITRATE-CITRIC ACID 500-334 MG/5ML PO SOLN: 30.0000 mL | ORAL | Status: DC | PRN

## 2022-01-08 MED ORDER — HYDROXYZINE HCL 25 MG PO TABS: 50.0000 mg | ORAL_TABLET | Freq: Four times a day (QID) | ORAL | Status: DC | PRN

## 2022-01-08 MED ORDER — ONDANSETRON HCL 4 MG/2ML IJ SOLN: 4.0000 mg | Freq: Four times a day (QID) | INTRAMUSCULAR | Status: DC | PRN

## 2022-01-08 MED ORDER — SIMETHICONE 80 MG PO CHEW: 80.0000 mg | CHEWABLE_TABLET | ORAL | Status: DC | PRN

## 2022-01-08 NOTE — OB Triage Note (Signed)
Sylvia Parks is a 26 yo G2P0 at [redacted]w[redacted]d presenting to triage for contractions that started this morning at 0300. She went to the office where she was dilated to 3cm. She was told to come to triage for futher evaluation. She denies LOF or vaginal bleeding but has good fetal movement. Three weeks ago, baby was IUGR at the 6%. She has another MFM appointment today at 1300.

## 2022-01-08 NOTE — OB Triage Note (Signed)
LABOR & DELIVERY OB TRIAGE NOTE  SUBJECTIVE  HPI Sylvia Parks is a 26 y.o. G2P0010 at [redacted]w[redacted]d who presents to Labor & Delivery from the office for contractions. She reports that her contractions started at 0300. She was noted to be 3/90/-3 in the office and she was advised to come to L&D for labor evaluation. Her pregnancy has been complicated by FGR, tobacco use, and use of vaginal progesterone d/t h/o of 18 week PPROM and pregnancy loss. She endorses good fetal movement and denies LOF and vaginal bleeding.  OB History     Gravida  2   Para      Term      Preterm      AB  1   Living         SAB  1   IAB      Ectopic      Multiple      Live Births              Scheduled Meds: Continuous Infusions: PRN Meds:.acetaminophen  OBJECTIVE  BP 126/73   Pulse 87   Temp 98.5 F (36.9 C) (Oral)   Resp 20   LMP 03/24/2021   NST I reviewed the NST and it was reactive.  Baseline: 135 Variability: moderate Accelerations: present Decelerations:none Toco: irregular, q3-5 min, moderate to palpation Category 1  ASSESSMENT Impression  1) Pregnancy at G2P0010, [redacted]w[redacted]d, Estimated Date of Delivery: 01/22/22. Reassuring maternal/fetal status 2) Cervix unchanged over 2 hours 3) Fetal growth restriction  PLAN  I discussed these findings with Dr. Parke Poisson, who recommends proceeding towards birth given her gestational age and FGR diagnosis. I reviewed with Dr. Logan Bores who is in agreement with this plan. Sylvia Parks and her partner are also pleased to proceed with induction of labor today.  I explained what the process of IOL entails, potential complications, and what would require a SCN admission for the baby. We will begin induction process once a room is available.  Sylvia Parks, CNM

## 2022-01-08 NOTE — Progress Notes (Signed)
NST today. Cervical check

## 2022-01-08 NOTE — H&P (Signed)
History and Physical   HPI  Sylvia Parks is a 26 y.o. G2P0010 at [redacted]w[redacted]d Estimated Date of Delivery: 01/22/22 who is being admitted for labor management. Her pregnancy has been complicated by fetal growth restriction with EFW in the 5th percentile. She came to triage this morning from the office with a SVE of 3/90/-3 at 0830 and complaints of contractions since 0300. She did not change her cervix over 2 hours. After consultation with Dr. Parke Poisson from MFM, the decision was made to proceed with induction of labor. Sylvia Parks remained in triage while awaiting a room and continued to have regular contractions. Upon admission to a labor room, her SVE was found to be 6/80/-1. She denies bloody show and LOF, and endorses good fetal movement.   OB History  OB History  Gravida Para Term Preterm AB Living  2 0 0 0 1 0  SAB IAB Ectopic Multiple Live Births  1 0 0 0 0    # Outcome Date GA Lbr Len/2nd Weight Sex Delivery Anes PTL Lv  2 Current           1 SAB 2021 [redacted]w[redacted]d    Vag-Spont   FD    PROBLEM LIST  Pregnancy complications or risks: Patient Active Problem List   Diagnosis Date Noted   Labor and delivery, indication for care 01/08/2022   Pregnant 12/31/2021   Abdominal complaints 12/31/2021   Abdominal pain 11/24/2021   Cervical insufficiency during pregnancy in second trimester, antepartum 09/30/2021   History of preterm premature rupture of membranes (PPROM) 07/30/2021   History of pregnancy loss in prior pregnancy, currently pregnant in second trimester 07/30/2021   Supervision of high risk pregnancy, antepartum 07/30/2021   Nausea & vomiting 06/10/2021   Hyperemesis 06/10/2021    Prenatal labs and studies: ABO, Rh: --/--/B POS (08/03 1213) Antibody: NEG (08/03 1213) Rubella: 2.65 (02/13 1518) RPR: Non Reactive (06/06 0928)  HBsAg: Negative (02/13 1518)  HIV: Non Reactive (06/06 0928)  ZTI:WPYKDXIP/-- (06/20 0028)   Past Medical History:  Diagnosis Date   Endometriosis     Hypertension      Past Surgical History:  Procedure Laterality Date   ADENOIDECTOMY     DILATION AND CURETTAGE OF UTERUS       Medications    Current Discharge Medication List     CONTINUE these medications which have NOT CHANGED   Details  Prenatal Vit-Fe Fumarate-FA (PRENATAL MULTIVITAMIN) TABS tablet Take 1 tablet by mouth daily at 12 noon.         Allergies  Patient has no known allergies.  Review of Systems  Pertinent items noted in HPI and remainder of comprehensive ROS otherwise negative.  Physical Exam  BP 104/79   Pulse 70   Temp 98.5 F (36.9 C) (Oral)   Resp 18   LMP 03/24/2021   Lungs:  CTAB Cardio: RRR without M/R/G Abd: Soft, gravid, NT Presentation: cephalic  CERVIX: Dilation: 6 Effacement (%): 80 Station: -1 Presentation: Vertex Exam by:: M.Daymeon Fischman, CNM  See Prenatal records for more detailed PE.     FHR:  Baseline: 135 Variability: moderate Accelerations: present Decelerations: absent Toco: regular, every 3-4 minutes Category 1  Test Results  Results for orders placed or performed during the hospital encounter of 01/08/22 (from the past 24 hour(s))  CBC     Status: Abnormal   Collection Time: 01/08/22 12:13 PM  Result Value Ref Range   WBC 16.5 (H) 4.0 - 10.5 K/uL   RBC 5.21 (H) 3.87 -  5.11 MIL/uL   Hemoglobin 11.7 (L) 12.0 - 15.0 g/dL   HCT 24.4 01.0 - 27.2 %   MCV 72.6 (L) 80.0 - 100.0 fL   MCH 22.5 (L) 26.0 - 34.0 pg   MCHC 31.0 30.0 - 36.0 g/dL   RDW 53.6 64.4 - 03.4 %   Platelets 248 150 - 400 K/uL   nRBC 0.0 0.0 - 0.2 %  Type and screen [facility]     Status: None   Collection Time: 01/08/22 12:13 PM  Result Value Ref Range   ABO/RH(D) B POS    Antibody Screen NEG    Sample Expiration      01/11/2022,2359 Performed at [facility], [address] [place], [place] [address]    Other: GBS results expired  Assessment   G2P0010 at [redacted]w[redacted]d Estimated Date of Delivery: 01/22/22   Reassuring maternal/fetal status. Active labor FGR  Patient Active Problem List   Diagnosis Date Noted   Labor and delivery, indication for care 01/08/2022   Pregnant 12/31/2021   Abdominal complaints 12/31/2021   Abdominal pain 11/24/2021   Cervical insufficiency during pregnancy in second trimester, antepartum 09/30/2021   History of preterm premature rupture of membranes (PPROM) 07/30/2021   History of pregnancy loss in prior pregnancy, currently pregnant in second trimester 07/30/2021   Supervision of high risk pregnancy, antepartum 07/30/2021   Nausea & vomiting 06/10/2021   Hyperemesis 06/10/2021    Plan  1. Admit to L&D  2. EFM: Continuous -- Category 1 3. Pharmacologic pain relief if desired.   4. Admission labs  5. Anticipate NSVD 6. Dr. Logan Bores notified of admission  [name], [facility] 01/08/2022 2:05 PM

## 2022-01-08 NOTE — Progress Notes (Signed)
Routine Prenatal Care Visit  Subjective  Sylvia Parks is a 26 y.o. G2P0010 at [redacted]w[redacted]d being seen today for ongoing prenatal care.  She is currently monitored for the following issues for this high-risk pregnancy and has Nausea & vomiting; Hyperemesis; History of preterm premature rupture of membranes (PPROM); History of pregnancy loss in prior pregnancy, currently pregnant in second trimester; Supervision of high risk pregnancy, antepartum; Cervical insufficiency during pregnancy in second trimester, antepartum; Abdominal pain; Pregnant; and Abdominal complaints on their problem list.  ----------------------------------------------------------------------------------- Patient reports she has been contracting since 0300. Denies LOF or vaginal bleeding. Has MFM visit today at 1300.   Contractions: Regular. Vag. Bleeding: None.   . Leaking Fluid denies.  ----------------------------------------------------------------------------------- The following portions of the patient's history were reviewed and updated as appropriate: allergies, current medications, past family history, past medical history, past social history, past surgical history and problem list. Problem list updated.  Objective  Blood pressure 126/84, weight 194 lb (88 kg), last menstrual period 03/24/2021. Pregravid weight 148 lb (67.1 kg) Total Weight Gain 46 lb (20.9 kg) Urinalysis: Urine Protein    Urine Glucose    Fetal Status:           General:  Alert, oriented and cooperative. Patient is in no acute distress.  Skin: Skin is warm and dry. No rash noted.   Cardiovascular: Normal heart rate noted  Respiratory: Normal respiratory effort, no problems with respiration noted  Abdomen: Soft, gravid, appropriate for gestational age.       Pelvic:  Cervical exam performed        Extremities: Normal range of motion.     Mental Status: Normal mood and affect. Normal behavior. Normal judgment and thought content.   Assessment    26 y.o. G2P0010 at [redacted]w[redacted]d by  01/22/2022, by Ultrasound presenting for routine prenatal visit  Plan   pregnancy Problems (from 05/30/21 to present)    Problem Noted Resolved   Cervical insufficiency during pregnancy in second trimester, antepartum 09/30/2021 by Federico Flake, MD No   Overview Signed 09/30/2021  3:29 PM by Federico Flake, MD    Declined cerclage and opted for vaginal progesterone      Supervision of high risk pregnancy, antepartum 07/30/2021 by Federico Flake, MD No   Overview Addendum 11/24/2021 11:16 PM by Ellwood Sayers, CNM     Nursing Staff Provider  Office Location  [facility] Dating  LMP/early [place]       Language   Anatomy US  normal  Flu Vaccine   Genetic/Carrier Screen  NIPS:   LR female AFP:    [place]izon:  TDaP Vaccine   11/17/2021 Hgb A1C or  GTT Early  Third trimester - high; 3 hr:one elevated-passed!  COVID Vaccine    LAB RESULTS   Rhogam   NA Blood Type B/Positive/-- (02/13 1518)   Baby Feeding Plan  breast Antibody Negative (02/13 1518)  Contraception  Rubella 2.65 (02/13 1518)  Circumcision  RPR Non Reactive (02/13 1518)   Pediatrician   HBsAg Negative (02/13 1518)   Support Person  HCVAb   Prenatal Classes  HIV Non Reactive (02/13 1518)     BTL Consent  GBS   (For PCN allergy, check sensitivities)   VBAC Consent  Pap Deferred to PP       DME Rx [ ]  BP cuff [ ]  Weight Scale Waterbirth  [ ]  Class [ ]  Consent [ ]  CNM visit  PHQ9 & GAD7 [  ] new OB [  ]  28 weeks  [  ] 36 weeks Induction  [ ]  Orders Entered [ ] Foley Y/N             Term labor symptoms and general obstetric precautions including but not limited to vaginal bleeding, contractions, leaking of fluid and fetal movement were reviewed in detail with the patient. Please refer to After Visit Summary for other counseling recommendations.   Return in about 1 week (around 01/15/2022) for return OB, cervical sweep? , CNM  01/08/2022 8:11 AM

## 2022-01-08 NOTE — Discharge Summary (Signed)
Postpartum Discharge Summary  Date of Service updated 01/10/2022     Patient Name: Sylvia Parks DOB: Jan 24, 1996 MRN: [id number]  Date of admission: 01/08/2022 Delivery date:01/08/2022  Delivering provider: Lurlean Horns  Date of discharge: 01/10/2022  Admitting diagnosis: Labor and delivery, indication for care [O75.9] Intrauterine pregnancy: [redacted]w[redacted]d    Secondary diagnosis:  Principal Problem:   Labor and delivery, indication for care Active Problems:   Postpartum care following vaginal delivery  Additional problems: Fetal growth restriction    Discharge diagnosis: Term Pregnancy Delivered                                              Post partum procedures: none Augmentation: AROM and Pitocin Complications: None  Hospital course: Onset of Labor With Vaginal Delivery      26y.o. yo G2P0010 at 360w0das admitted in Latent Labor on 01/08/2022. Patient had an uncomplicated labor course as follows:  Membrane Rupture Time/Date: 7:34 PM ,01/08/2022   Delivery Method:Vaginal, Spontaneous  Episiotomy: None  Lacerations:   second degree Patient had an uncomplicated postpartum course.  She is ambulating, tolerating a regular diet, passing flatus, and urinating well. Patient is discharged home in stable condition on 01/10/22.  Newborn Data: Birth date:01/08/2022  Birth time:9:59 PM  Gender:Female  Living status:Living  Apgars:9 ,10  Weight:2720 g   Magnesium Sulfate received: No BMZ received: No Rhophylac:No MMR:N/A T-DaP:Given prenatally Flu: N/A Transfusion:No  Physical exam  Vitals:   01/09/22 1124 01/09/22 2036 01/09/22 2324 01/10/22 0820  BP: 131/79 122/79 123/77 136/79  Pulse: 74 70 75 71  Resp: '18 20 20 20  ' Temp: 98 F (36.7 C) 98.3 F (36.8 C) 98.3 F (36.8 C) 98.6 F (37 C)  TempSrc: Oral   Oral  SpO2:  98% 97% 99%   General: alert, cooperative, and no distress Lochia: appropriate Uterine Fundus: firm Incision: Healing well with no significant  drainage DVT Evaluation: No evidence of DVT seen on physical exam. Negative Homan's sign. Labs: Lab Results  Component Value Date   WBC 12.2 (H) 01/10/2022   HGB 9.8 (L) 01/10/2022   HCT 32.3 (L) 01/10/2022   MCV 73.9 (L) 01/10/2022   PLT 197 01/10/2022      Latest Ref Rng & Units 07/13/2021    8:21 PM  CMP  Glucose 70 - 99 mg/dL 115   BUN 6 - 20 mg/dL 6   Creatinine 0.44 - 1.00 mg/dL 0.65   Sodium 135 - 145 mmol/L 135   Potassium 3.5 - 5.1 mmol/L 3.4   Chloride 98 - 111 mmol/L 102   CO2 22 - 32 mmol/L 25   Calcium 8.9 - 10.3 mg/dL 9.2   Total Protein 6.5 - 8.1 g/dL 7.1   Total Bilirubin 0.3 - 1.2 mg/dL 0.5   Alkaline Phos 38 - 126 U/L 49   AST 15 - 41 U/L 13   ALT 0 - 44 U/L 12    Edinburgh Score:    01/10/2022   10:00 AM  Edinburgh Postnatal Depression Scale Screening Tool  I have been able to laugh and see the funny side of things. 0  I have looked forward with enjoyment to things. 0  I have blamed myself unnecessarily when things went wrong. 1  I have been anxious or worried for no good reason. 1  I have felt  scared or panicky for no good reason. 1  Things have been getting on top of me. 0  I have been so unhappy that I have had difficulty sleeping. 0  I have felt sad or miserable. 0  I have been so unhappy that I have been crying. 0  The thought of harming myself has occurred to me. 0  Edinburgh Postnatal Depression Scale Total 3      After visit meds:     Discharge home in stable condition Infant Feeding: Breast Infant Disposition:home with mother Discharge instruction: per After Visit Summary and Postpartum booklet. Activity: Advance as tolerated. Pelvic rest for 6 weeks.  Diet: routine diet Anticipated Birth Control: IUD Postpartum Appointment:6 weeks Additional Postpartum F/U: Postpartum Depression checkup PRN Future Appointments: Future Appointments  Date Time Provider [place]  01/15/2022  1:35 PM Rosario Adie, MD [facility] None    Follow up Visit:  [place] .   Contact information: [place] [address] [phone]         [facility] Follow up in 6 week(s).   Why: Please call the [place] office on Monday and make an appointment for a postpartum phsyical and to discuss birth control at 6 weeks post delivery. Contact information: [address] [place] [address] [phone]                     01/10/2022 Imagene Riches, CNM

## 2022-01-08 NOTE — Progress Notes (Signed)
LABOR NOTE   SUBJECTIVE:   Sylvia Parks is a 26 y.o.GP@ at [redacted]w[redacted]d who is now pushing spontaneously. She is feeling exhausted and is having difficulty coping. She has good support at the bedside. She accidentally dislodged her IV and so Pitocin was discontinued, but she continues to have regular, strong contractions.  Analgesia: Labor support without medications  OBJECTIVE:  BP 132/78 (BP Location: Right Arm)   Pulse 82   Temp 97.8 F (36.6 C) (Oral)   Resp 18   LMP 03/24/2021  No intake/output data recorded.  SVE:   Dilation: 10 Effacement (%): 100 Station: Plus 1, Plus 2 Exam by:: Quitman Livings CNM CONTRACTIONS: regular, every 2 minutes FHR: Fetal heart tracing reviewed. Baseline: 135 Variability: moderate Accelerations: present Decelerations:none Category 1  Labs: Lab Results  Component Value Date   WBC 16.5 (H) 01/08/2022   HGB 11.7 (L) 01/08/2022   HCT 37.8 01/08/2022   MCV 72.6 (L) 01/08/2022   PLT 248 01/08/2022    ASSESSMENT: 1) Spontaneous labor, progressing normally     Coping: having difficulty but managing with support     Membranes: ruptured, clear fluid   Principal Problem:   Labor and delivery, indication for care Fetal growth restriction  PLAN: Continue pushing Anticipate NSVD  Sylvia Parks, CNM 01/08/2022 9:27 PM

## 2022-01-08 NOTE — Progress Notes (Signed)
LABOR NOTE   SUBJECTIVE:   Sylvia Parks is a 26 y.o.GP@ at [redacted]w[redacted]d who is here for labor management. She has been laboring spontaneously and coping well. She is feeling a lot of pressure and her contractions have spaced out some. Her cervical exam is now 6/100/0. We discussed Pitocin augmentation, and Sylvia Parks is in agreement with that plan.  Analgesia: Labor support without medications  OBJECTIVE:  BP 121/74   Pulse 82   Temp 98.5 F (36.9 C) (Oral)   Resp 20   LMP 03/24/2021  No intake/output data recorded.  SVE:   Dilation: 6 Effacement (%): 100 Station: 0 Exam by:: M.Sabra Sessler, CNM CONTRACTIONS: regular, every 2-5 minutes FHR: Fetal heart tracing reviewed. Baseline: 135 Variability: moderate Accelerations: present Decelerations:none Category 1  Labs: Lab Results  Component Value Date   WBC 16.5 (H) 01/08/2022   HGB 11.7 (L) 01/08/2022   HCT 37.8 01/08/2022   MCV 72.6 (L) 01/08/2022   PLT 248 01/08/2022    ASSESSMENT: 1)  Spontaneous labor , some cervical change     Coping: well     Membranes: intact   Principal Problem:   Labor and delivery, indication for care   PLAN: IV Pitocin augmentation Encouraged position changes Anticipate NSVD Dr. Logan Bores updated  Sylvia Parks, CNM 01/08/2022 5:16 PM

## 2022-01-09 ENCOUNTER — Encounter: Payer: Self-pay | Admitting: Obstetrics and Gynecology

## 2022-01-09 LAB — CBC
HCT: 32.8 % — ABNORMAL LOW (ref 36.0–46.0)
Hemoglobin: 10.1 g/dL — ABNORMAL LOW (ref 12.0–15.0)
MCH: 22.6 pg — ABNORMAL LOW (ref 26.0–34.0)
MCHC: 30.8 g/dL (ref 30.0–36.0)
MCV: 73.4 fL — ABNORMAL LOW (ref 80.0–100.0)
Platelets: 208 10*3/uL (ref 150–400)
RBC: 4.47 MIL/uL (ref 3.87–5.11)
RDW: 15.1 % (ref 11.5–15.5)
WBC: 20.5 10*3/uL — ABNORMAL HIGH (ref 4.0–10.5)
nRBC: 0 % (ref 0.0–0.2)

## 2022-01-09 LAB — RPR: RPR Ser Ql: NONREACTIVE

## 2022-01-09 MED ORDER — WITCH HAZEL-GLYCERIN EX PADS
MEDICATED_PAD | CUTANEOUS | Status: AC
  Filled 2022-01-09: qty 100

## 2022-01-09 MED ORDER — BENZOCAINE-MENTHOL 20-0.5 % EX AERO
INHALATION_SPRAY | CUTANEOUS | Status: AC
  Filled 2022-01-09: qty 56

## 2022-01-09 NOTE — Lactation Note (Signed)
This note was copied from a baby's chart. Lactation Consultation Note  Patient Name: Sylvia Parks [id number] Date: 01/09/2022 Reason for consult: Initial assessment;Primapara;Early term 37-38.6wks Age:24 hours  Maternal Data Has patient been taught Hand Expression?: Yes Does the patient have breastfeeding experience prior to this delivery?: No  Feeding Mother's Current Feeding Choice: Breast Milk Baby rooting and I had not seen latch, baby latched well to right breast in cradle hold but tends to latch to tip of nipple then pulll in breast, mom shown how to sandwich breast to get deeper latch and how to flange lips/ use chin pressure to widen latch, less discomfort after this, swallows heard and seen, baby sleepy at breast and had recently fed, came off of breast at 5 min. LATCH Score Latch: Grasps breast easily, tongue down, lips flanged, rhythmical sucking.  Audible Swallowing: Spontaneous and intermittent  Type of Nipple: Everted at rest and after stimulation  Comfort (Breast/Nipple): Filling, red/small blisters or bruises, mild/mod discomfort (mild tenderness)  Hold (Positioning): Assistance needed to correctly position infant at breast and maintain latch.  LATCH Score: 8   Lactation Tools Discussed/Used  LC name and no written on white board earlier this am on rounds  Interventions Interventions: Breast feeding basics reviewed;Assisted with latch;Skin to skin;Hand express;Breast compression;Support pillows;Education (purelan given for nipple tenderness with instruction in use)  Discharge Pump: Personal WIC Program: No  Consult Status Consult Status: PRN    Dyann Kief 01/09/2022, 4:12 PM

## 2022-01-09 NOTE — Progress Notes (Signed)
Obstetric Postpartum Daily Progress Note Subjective:  26 y.o. G2P1011 postpartum day #1 status post vaginal delivery.  She is ambulating, is tolerating po, is voiding spontaneously.  Has not had a BM since Saturday. Her pain is well controlled on PO pain medications. Her lochia is less than menses. Breastfeeding is improving, has been abel to latch infant independently today.    Medications SCHEDULED MEDICATIONS   docusate sodium  100 mg Oral BID   ibuprofen  600 mg Oral Q6H   prenatal multivitamin  1 tablet Oral Q1200    MEDICATION INFUSIONS    PRN MEDICATIONS  acetaminophen, benzocaine-Menthol, coconut oil, diphenhydrAMINE, methylergonovine **OR** methylergonovine, oxyCODONE-acetaminophen, simethicone, witch hazel-glycerin    Objective:   Vitals:   01/09/22 0218 01/09/22 0433 01/09/22 0809 01/09/22 1124  BP: 120/63 111/72 125/85 131/79  Pulse: 66 69 70 74  Resp: 20 18 20 18   Temp: 98.1 F (36.7 C) 98.8 F (37.1 C) 98 F (36.7 C) 98 F (36.7 C)  TempSrc:   Oral Oral  SpO2: 98% 98% 99%     Current Vital Signs 24h Vital Sign Ranges  T 98 F (36.7 C) Temp  Avg: 98.1 F (36.7 C)  Min: 97.8 F (36.6 C)  Max: 98.8 F (37.1 C)  BP 131/79 BP  Min: 98/49  Max: 150/64  HR 74 Pulse  Avg: 87.3  Min: 66  Max: 120  RR 18 Resp  Avg: 19.2  Min: 18  Max: 20  SaO2 99 % Room Air SpO2  Avg: 98.3 %  Min: 98 %  Max: 99 %       24 Hour I/O Current Shift I/O  Time Ins Outs 08/03 0701 - 08/04 0700 In: -  Out: 150  No intake/output data recorded.  General: NAD Breasts: soft, no redness or masses. Nipples erect and intact bilaterally Good latch witnessed on left breast, audible swallows.  Pulmonary: no increased work of breathing Abdomen: non-distended, non-tender, fundus firm at level of umbilicus Perineum: well approximated, swelling  Extremities: trace edema, no erythema, no tenderness  Labs:  Recent Labs  Lab 01/08/22 1213 01/09/22 0545  WBC 16.5* 20.5*  HGB 11.7* 10.1*  HCT  37.8 32.8*  PLT 248 208     Assessment:   26 y.o. G2P1011 postpartum day # 1 status post SVB  Plan:   1) Acute blood loss anemia - hemodynamically stable and asymptomatic - po ferrous sulfate  2) B POS / Rubella 2.65 (02/13 1518)/ Varicella Immune  3) TDAP status received in antenatal period   4) breast /Contraception = IUD, BTL in the future   5) Disposition discharge 01/10/22  03/12/22 [facility], CNM  01/09/2022 3:21 PM

## 2022-01-10 LAB — CBC
HCT: 32.3 % — ABNORMAL LOW (ref 36.0–46.0)
Hemoglobin: 9.8 g/dL — ABNORMAL LOW (ref 12.0–15.0)
MCH: 22.4 pg — ABNORMAL LOW (ref 26.0–34.0)
MCHC: 30.3 g/dL (ref 30.0–36.0)
MCV: 73.9 fL — ABNORMAL LOW (ref 80.0–100.0)
Platelets: 197 10*3/uL (ref 150–400)
RBC: 4.37 MIL/uL (ref 3.87–5.11)
RDW: 15.5 % (ref 11.5–15.5)
WBC: 12.2 10*3/uL — ABNORMAL HIGH (ref 4.0–10.5)
nRBC: 0 % (ref 0.0–0.2)

## 2022-01-10 MED ORDER — IBUPROFEN 600 MG PO TABS: 600.0000 mg | ORAL_TABLET | Freq: Four times a day (QID) | ORAL | 0 refills | Status: AC

## 2022-01-10 NOTE — Progress Notes (Signed)
Discharge instructions reviewed with patient and partner who verbalized understanding. Follow up information given. Patient denied further questions or concerns at this time. Discharged home with infant.

## 2022-01-13 ENCOUNTER — Encounter: Payer: Self-pay | Admitting: Licensed Practical Nurse

## 2022-01-15 ENCOUNTER — Encounter: Payer: Medicaid Other | Admitting: Obstetrics & Gynecology

## 2022-01-21 ENCOUNTER — Ambulatory Visit (INDEPENDENT_AMBULATORY_CARE_PROVIDER_SITE_OTHER): Payer: Medicaid Other | Admitting: Obstetrics & Gynecology

## 2022-01-21 DIAGNOSIS — Z3A38 38 weeks gestation of pregnancy: Secondary | ICD-10-CM

## 2022-02-12 NOTE — Progress Notes (Signed)
Subjective:     Sylvia Parks is a 26 y.o. female who presents for a postpartum visit. She is 2 weeks postpartum following a spontaneous vaginal delivery. I have fully reviewed the prenatal and intrapartum course. The delivery was at 38 gestational weeks. Outcome: spontaneous vaginal delivery. Anesthesia: none. Postpartum course has been uncomplicated. Baby's course has been uncomplicated. Baby is feeding by bottle - unknown . Bleeding no bleeding and clear. Bowel function is normal. Bladder function is normal. Patient is not sexually active. Contraception method is none. Postpartum depression screening: negative.  The following portions of the patient's history were reviewed and updated as appropriate: allergies, current medications, past family history, past medical history, past social history, past surgical history, and problem list.  Review of Systems A comprehensive review of systems was negative.   Objective:    BP 102/70   Pulse 84   Resp 16   Wt 172 lb 6.4 oz (78.2 kg)   LMP 03/24/2021   SpO2 99%   Breastfeeding Yes   BMI 29.59 kg/m   General:  alert, cooperative, and no distress   Breasts:    Lungs: clear to auscultation bilaterally  Heart:  regular rate and rhythm  Abdomen:  Soft tender   Vulva:   deferred  Vagina:  Stitches intact  Cervix:  deferref  Corpus:  deferred  Adnexa:   deferred  Rectal Exam:  deferred        Assessment:    2 wk and incision check postpartum exam. Pap smear not done at today's visit.   Plan:    1. Contraception:  abstinence, interested in Mirena 2. RTO as scheduled.   Linzie Collin, MD  02/12/2022 1:55 AM

## 2022-02-19 ENCOUNTER — Other Ambulatory Visit (HOSPITAL_COMMUNITY)
Admission: RE | Admit: 2022-02-19 | Discharge: 2022-02-19 | Disposition: A | Discharge status: Home / Self Care | Payer: Medicaid Other | Source: Ambulatory Visit | Attending: Licensed Practical Nurse | Admitting: Licensed Practical Nurse

## 2022-02-19 ENCOUNTER — Ambulatory Visit (INDEPENDENT_AMBULATORY_CARE_PROVIDER_SITE_OTHER): Payer: Medicaid Other | Admitting: Licensed Practical Nurse

## 2022-02-19 ENCOUNTER — Encounter: Payer: Self-pay | Admitting: Licensed Practical Nurse

## 2022-02-19 DIAGNOSIS — Z124 Encounter for screening for malignant neoplasm of cervix: Secondary | ICD-10-CM | POA: Diagnosis not present

## 2022-02-19 DIAGNOSIS — Z30014 Encounter for initial prescription of intrauterine contraceptive device: Secondary | ICD-10-CM

## 2022-02-19 DIAGNOSIS — F419 Anxiety disorder, unspecified: Secondary | ICD-10-CM

## 2022-02-19 MED ORDER — LEVONORGESTREL 20 MCG/DAY IU IUD
1.0000 | INTRAUTERINE_SYSTEM | Freq: Once | INTRAUTERINE | Status: AC
  Administered 2022-02-19: 1 via INTRAUTERINE

## 2022-02-19 MED ORDER — ESCITALOPRAM OXALATE 10 MG PO TABS: 10.0000 mg | ORAL_TABLET | Freq: Every day | ORAL | 1 refills | Status: AC

## 2022-02-19 NOTE — Progress Notes (Signed)
Postpartum Visit  Chief Complaint:  Chief Complaint  Patient presents with  . Postpartum Care  . Anxiety  . Contraception    Iud     History of Present Illness: Patient is a 26 y.o. G2P1011 presents for postpartum visit.  Date of delivery: *** Type of delivery: Vaginal delivery - Vacuum or forceps assisted  {yes/no:63} Episiotomy No.  Laceration: {yes/no:63}  Pregnancy or labor problems:  {yes/no:63} Any problems since the delivery:  {yes/no:63}  Newborn Details:  SINGLETON :  1. Baby's name: ***. Birth weight: *** Maternal Details:  Breast Feeding:  {yes/no:63} Post partum depression/anxiety noted:  {yes/no:63} Edinburgh Post-Partum Depression Score:  {NUMBERS:20191}  Date of last PAP: ***  {norm/abn:16337}   Past Medical History:  Diagnosis Date  . Endometriosis   . Hypertension     Past Surgical History:  Procedure Laterality Date  . ADENOIDECTOMY    . DILATION AND CURETTAGE OF UTERUS      Prior to Admission medications   Medication Sig Start Date End Date Taking? Authorizing Provider  ibuprofen (ADVIL) 600 MG tablet Take 1 tablet (600 mg total) by mouth every 6 (six) hours. 01/10/22  Yes Mirna Mires, CNM  Prenatal Vit-Fe Fumarate-FA (PRENATAL MULTIVITAMIN) TABS tablet Take 1 tablet by mouth daily at 12 noon. Patient not taking: Reported on 02/19/2022    [provider]    No Known Allergies   Social History   Socioeconomic History  . Marital status: Single    Spouse name: Gregary Signs  . Number of children: Not on file  . Years of education: Not on file  . Highest education level: Not on file  Occupational History  . Not on file  Tobacco Use  . Smoking status: Former  . Smokeless tobacco: Never  Vaping Use  . Vaping Use: Former  . Quit date: 05/01/2021  Substance and Sexual Activity  . Alcohol use: No  . Drug use: Never  . Sexual activity: Not Currently    Partners: Male  Other Topics Concern  . Not on file  Social History  Narrative  . Not on file   Social Determinants of Health   Financial Resource Strain: Not on file  Food Insecurity: Not on file  Transportation Needs: Not on file  Physical Activity: Not on file  Stress: Not on file  Social Connections: Not on file  Intimate Partner Violence: Not on file    Family History  Problem Relation Age of Onset  . Asthma Father   . Hypertension Father   . Diabetes Maternal Grandmother   . Diabetes Paternal Grandmother   . Birth defects Neg Hx   . Cancer Neg Hx   . Hearing loss Neg Hx   . Stroke Neg Hx     ROS   Physical Exam BP 120/80   Ht 5\' 4"  (1.626 m)   Wt 170 lb (77.1 kg)   LMP 03/24/2021   Breastfeeding No   BMI 29.18 kg/m   OBGyn Exam      GYNECOLOGY OFFICE PROCEDURE NOTE  Renia Mikelson is a 26 y.o. G2P1011 here for a Mirena IUD insertion. No GYN concerns.  Last pap smear was on 3 years ago and was normal.  The patient is currently using PP for contraception and her LMP is Patient's last menstrual period was 03/24/2021..  The indication for her IUD is contraception/cycle control.  IUD Insertion Procedure Note Patient identified, informed consent performed, consent signed.   Discussed risks of irregular bleeding, cramping,  infection, malpositioning, expulsion or uterine perforation of the IUD (1:1000 placements)  which may require further procedure such as laparoscopy.  IUD while effective at preventing pregnancy do not prevent transmission of sexually transmitted diseases and use of barrier methods for this purpose was discussed. Time out was performed.  Urine pregnancy test negative.  Speculum placed in the vagina.  Cervix visualized.  Cleaned with Betadine x 2.  Grasped anteriorly with a single tooth tenaculum.  Uterus sounded to 6 cm. IUD placed per manufacturer's recommendations.  Strings trimmed to 3 cm. Tenaculum was removed, good hemostasis noted.  Patient tolerated procedure well.   Patient was given post-procedure  instructions.  She was advised to have backup contraception for one week.  Patient was also asked to check IUD strings periodically and follow up in 6 weeks for IUD check.  Carie Caddy, CNM  [facility], [facility]   Female Chaperone present during breast and/or pelvic exam.  Assessment: 26 y.o. G2P1011 presenting for 6 week postpartum visit  Plan: Problem List Items Addressed This Visit   None    1) Contraception Education IUD inserted, see note   2)  Pap - ASCCP guidelines and rational discussed.  Patient opts for 3 screening interval  3) Patient underwent screening for postpartum depression with Yes concerns noted. Concer for Anxiety, Will start Lexapro 10mg  daily   4) RTC 4-6 weeks for IUD and  mood check   , CNM  Carie Caddy, [facility]  02/19/22  2:57 PM

## 2022-02-23 ENCOUNTER — Encounter: Payer: Self-pay | Admitting: Licensed Practical Nurse

## 2022-02-28 LAB — CYTOLOGY - PAP
Chlamydia: NEGATIVE
Comment: NEGATIVE
Comment: NEGATIVE
Comment: NEGATIVE
Comment: NORMAL
Diagnosis: UNDETERMINED — AB
High risk HPV: NEGATIVE
Neisseria Gonorrhea: NEGATIVE
Trichomonas: NEGATIVE

## 2022-03-11 ENCOUNTER — Encounter: Payer: Self-pay | Admitting: Licensed Practical Nurse

## 2022-03-25 ENCOUNTER — Ambulatory Visit: Payer: Medicaid Other | Admitting: Licensed Practical Nurse

## 2023-06-24 IMAGING — US US OB < 14 WEEKS - US OB TV
1 series · 14 of 28 positions shown · non-contrast
Comparison: None.

CLINICAL DATA: Right-sided cramping

EXAM:
OBSTETRIC <14 WK US AND TRANSVAGINAL OB US
TECHNIQUE: Both transabdominal and transvaginal ultrasound examinations were
performed for complete evaluation of the gestation as well as the
maternal uterus, adnexal regions, and pelvic cul-de-sac.
Transvaginal technique was performed to assess early pregnancy.

[Series 1: us ob less than 14 weeks with ob transvaginal · 14 of 64 slices shown]
[im 3/64]
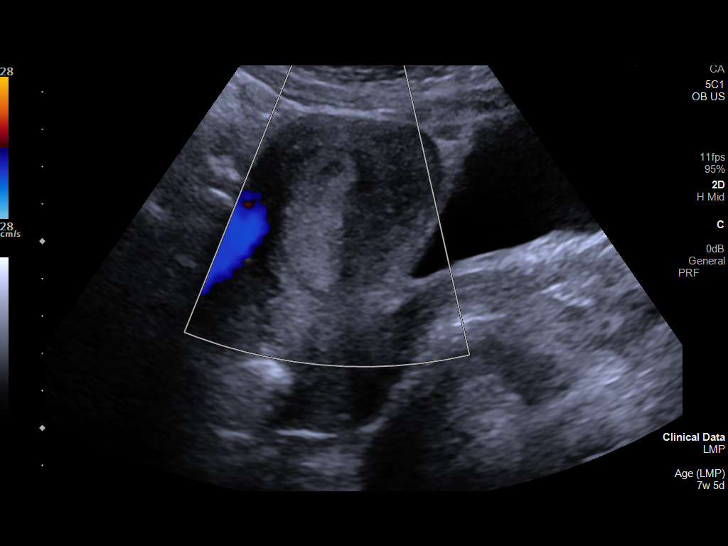
[im 8/64]
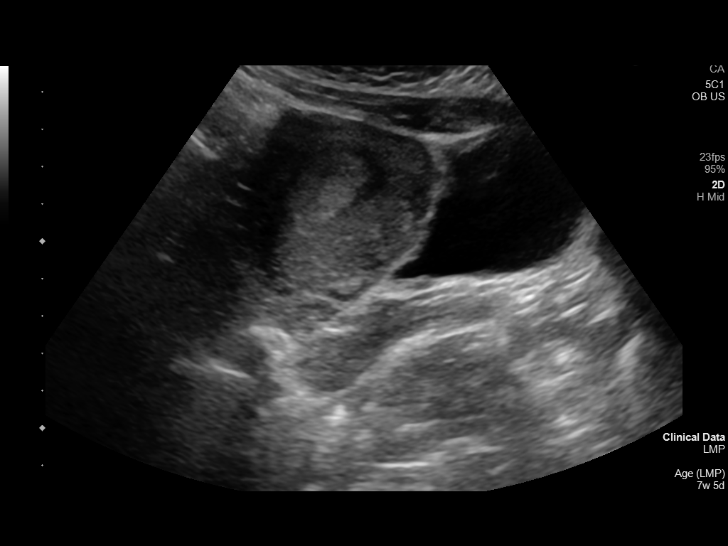
[im 12/64]
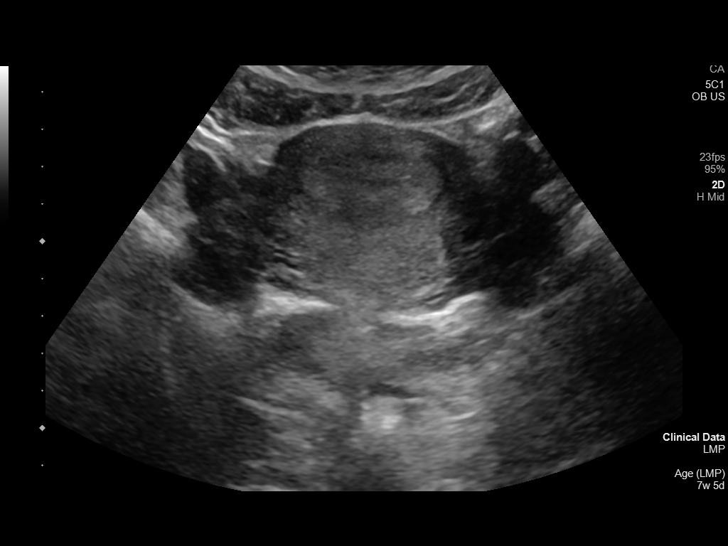
[im 17/64]
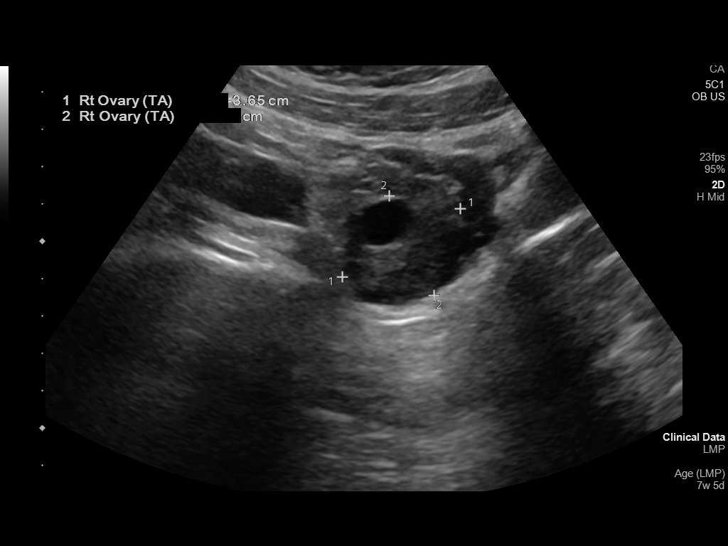
[im 22/64]
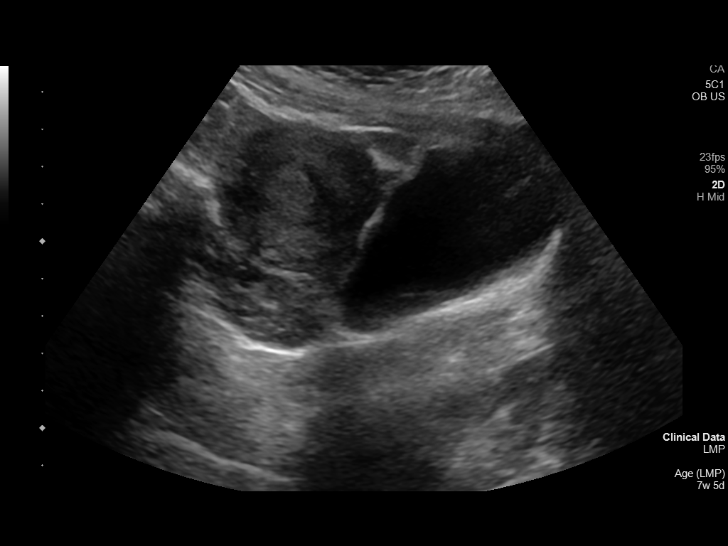
[im 26/64]
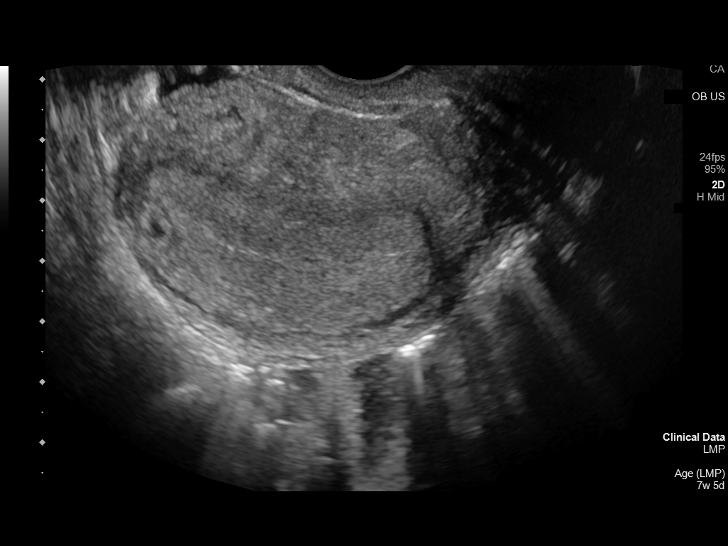
[im 31/64]
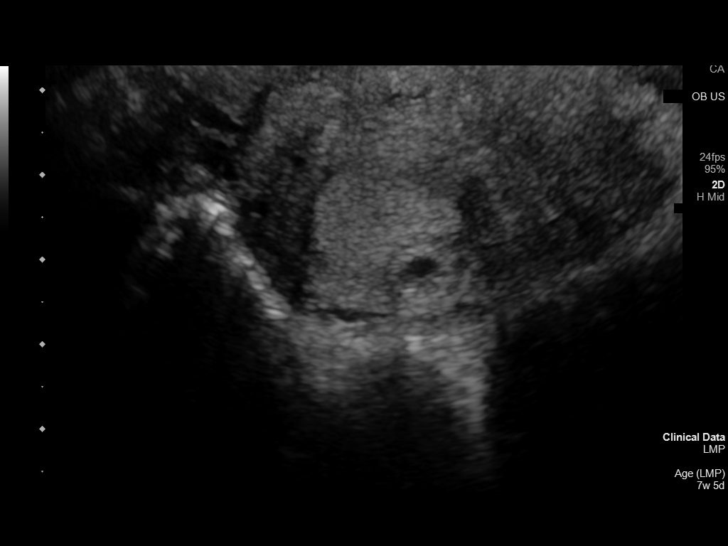
[im 36/64]
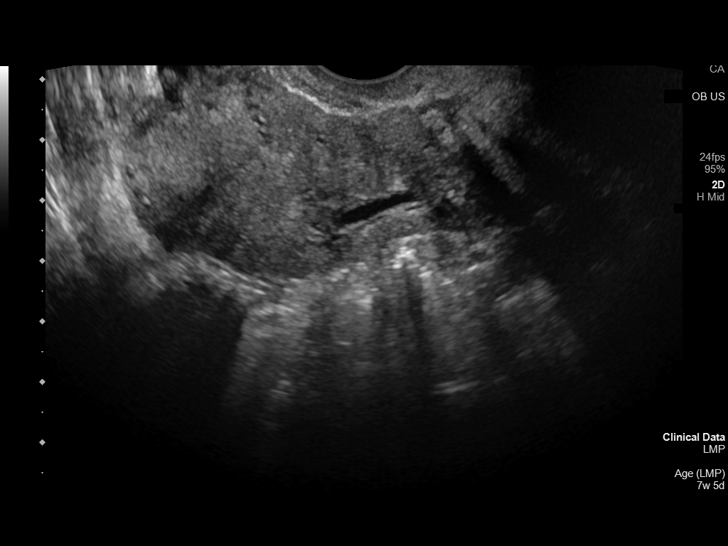
[im 40/64]
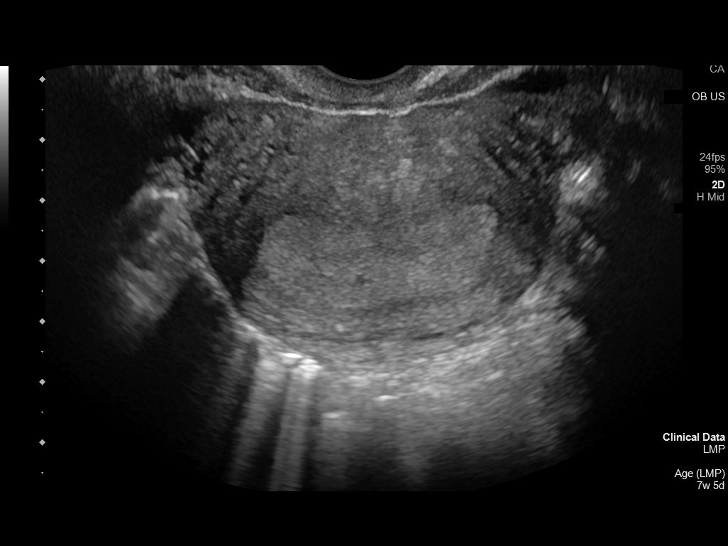
[im 45/64]
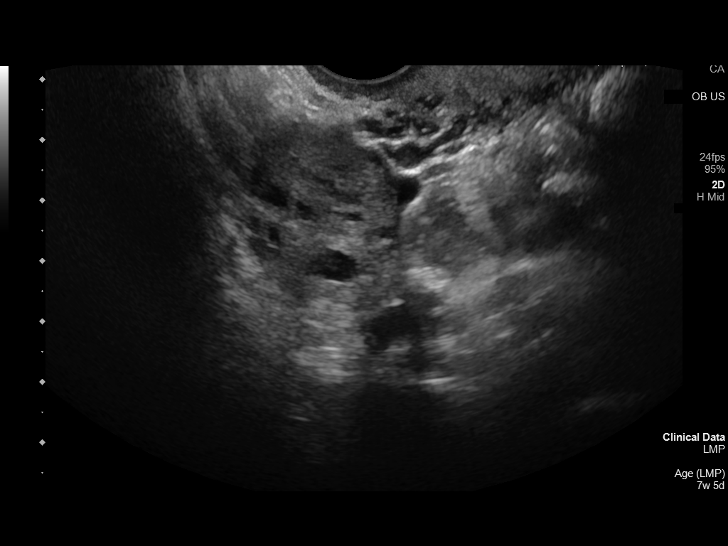
[im 50/64]
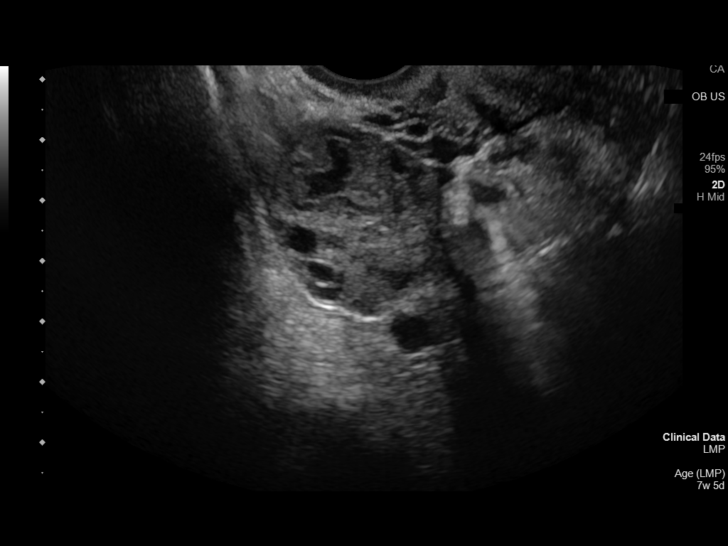
[im 54/64]
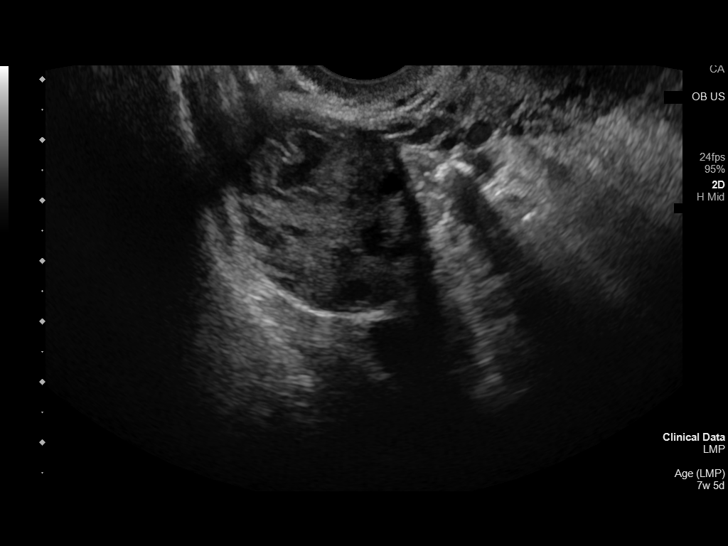
[im 59/64]
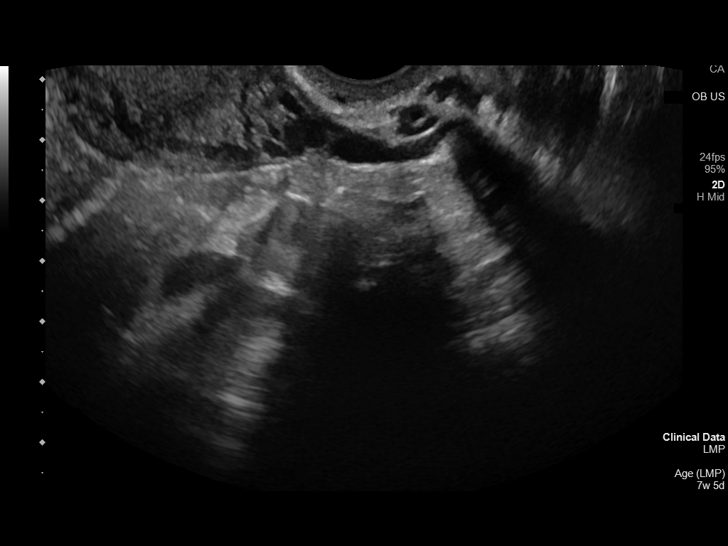
[im 64/64]
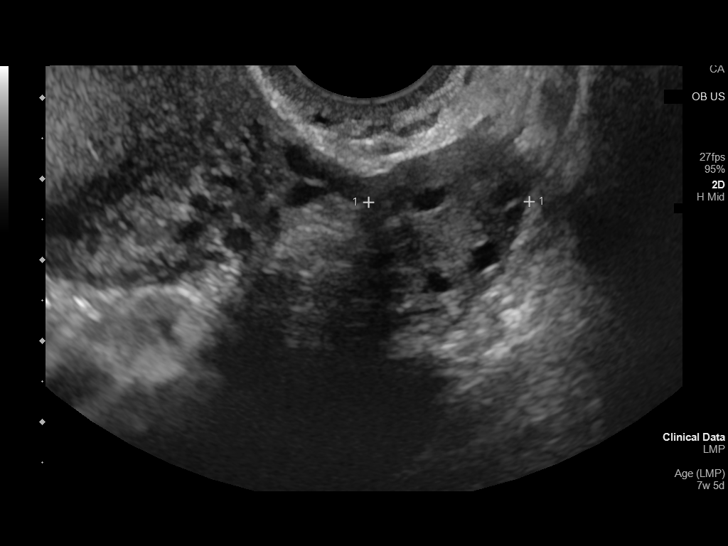

[14 of 28 positions shown; findings below may reference images not displayed]

FINDINGS: Intrauterine gestational sac: Single

Yolk sac:  Not Visualized.

Embryo:  Not Visualized.

Cardiac Activity: Not Visualized.

Heart Rate:   Not applicable.

MSD: 3.7 mm   5 w   1 d

US EDC: Not applicable

Subchorionic hemorrhage:  None visualized.

Maternal uterus/adnexae: 1.7 cm complex likely involuting
cyst/corpus luteum of the right ovary. 1.9 cm simple appearing
follicle of the right ovary also noted. Both ovaries demonstrate
color doppler flow. No suspicious adnexal mass or free fluid
identified.
IMPRESSION: Small intrauterine cystic structure which may represent a
gestational sac with no fetal pole or heart tones identified.
Correlate with serial beta HCG and follow-up with ultrasound in
10-14 days.

## 2023-07-09 IMAGING — US US OB < 14 WEEKS - US OB TV
1 series · 13 of 28 positions shown · non-contrast
Comparison: 05/13/2021

CLINICAL DATA: Viability

EXAM:
OBSTETRIC <14 WK US AND TRANSVAGINAL OB US
TECHNIQUE: Both transabdominal and transvaginal ultrasound examinations were
performed for complete evaluation of the gestation as well as the
maternal uterus, adnexal regions, and pelvic cul-de-sac.
Transvaginal technique was performed to assess early pregnancy.

[Series 1: us ob < 14 weeks - us ob tv · 0.09mm/px · 13 of 105 slices shown]
[im 4/105]
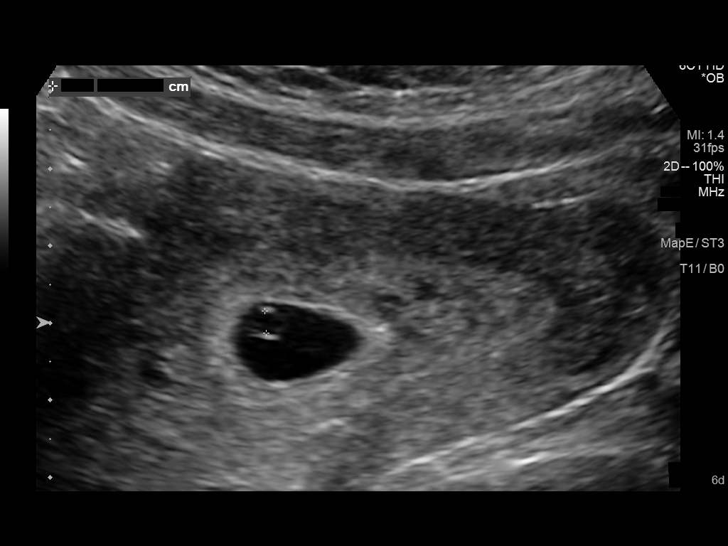
[im 12/105]
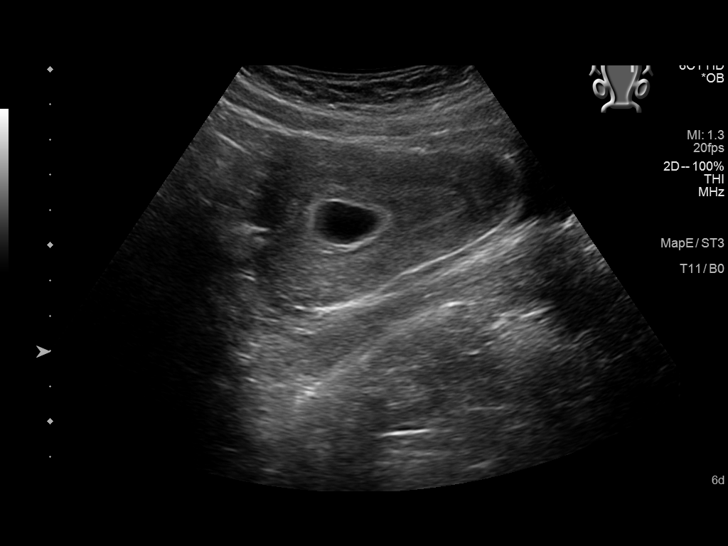
[im 20/105]
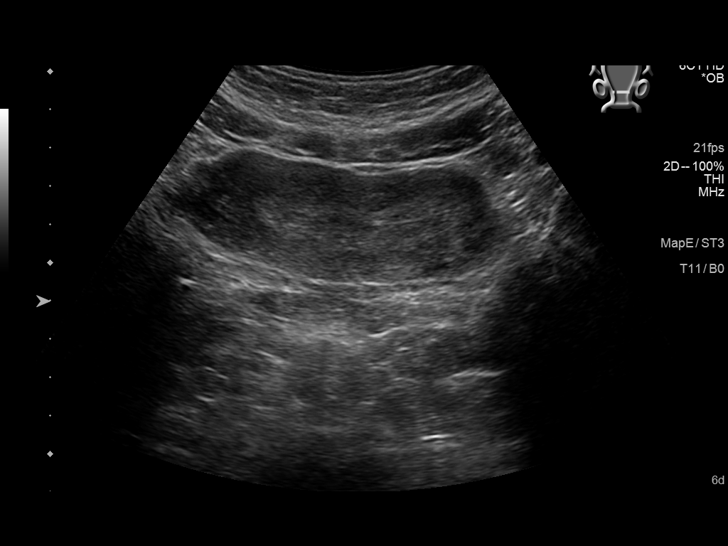
[im 27/105]
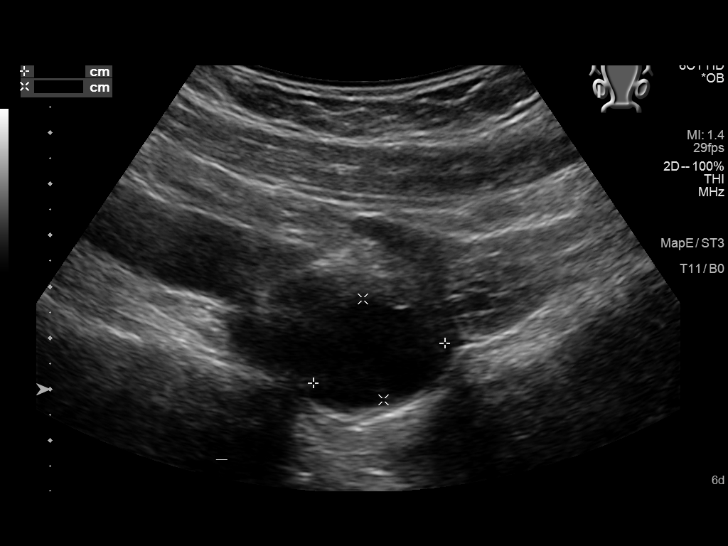
[im 35/105]
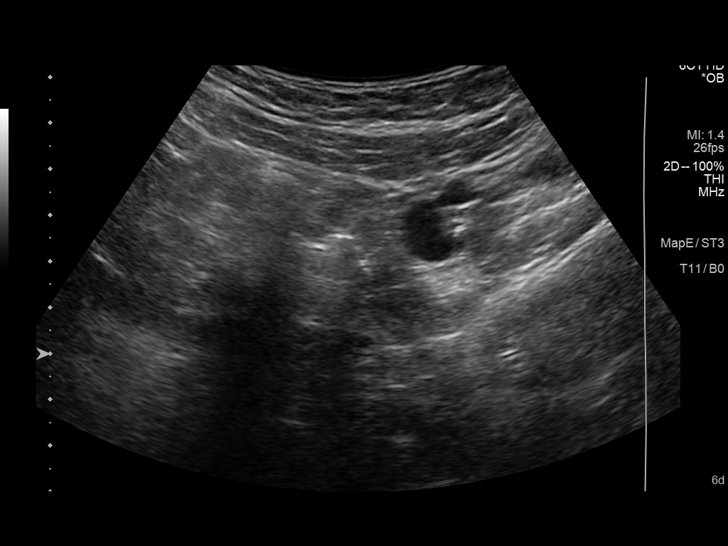
[im 43/105]
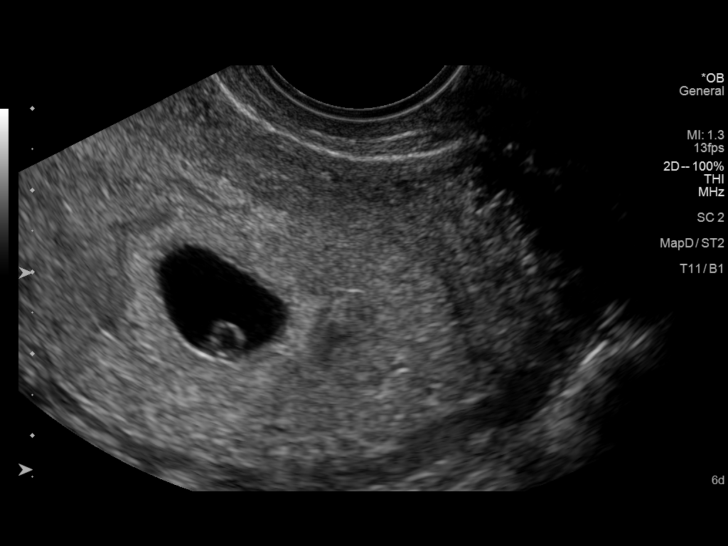
[im 54/105]
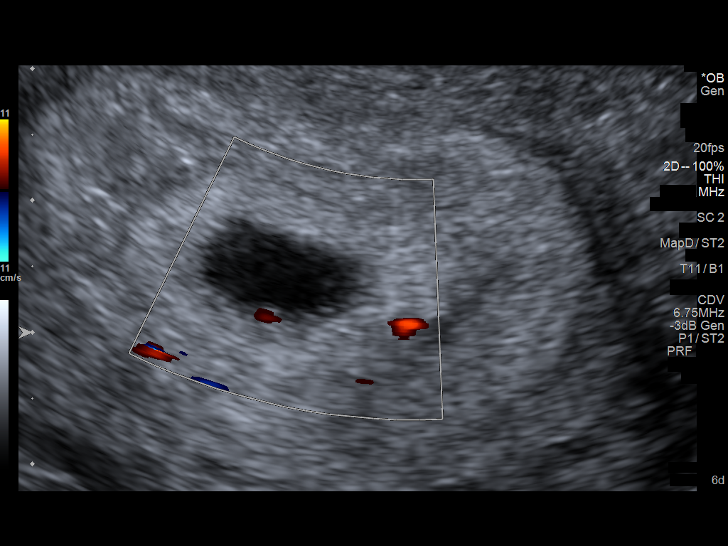
[im 62/105]
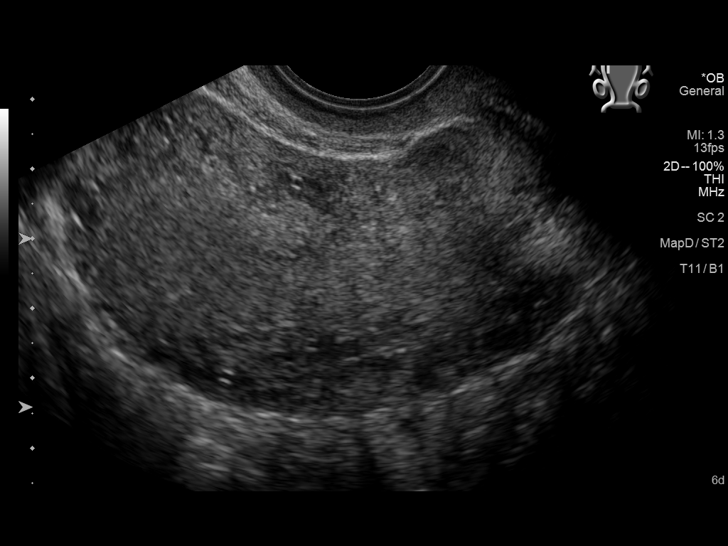
[im 70/105]
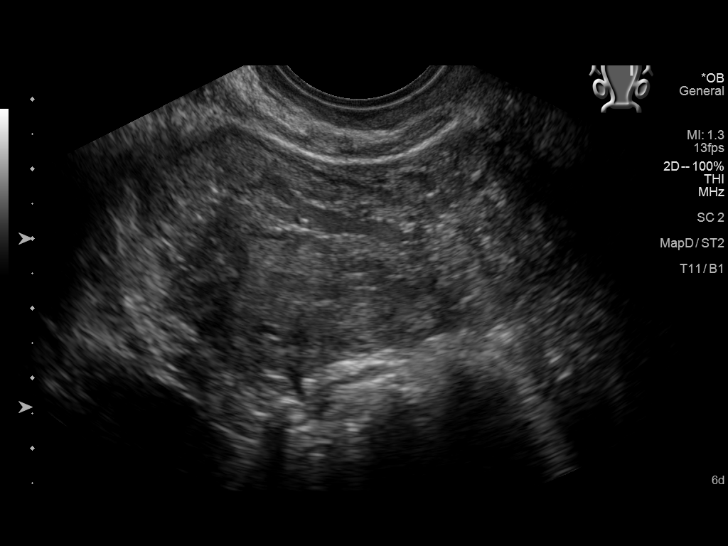
[im 78/105]
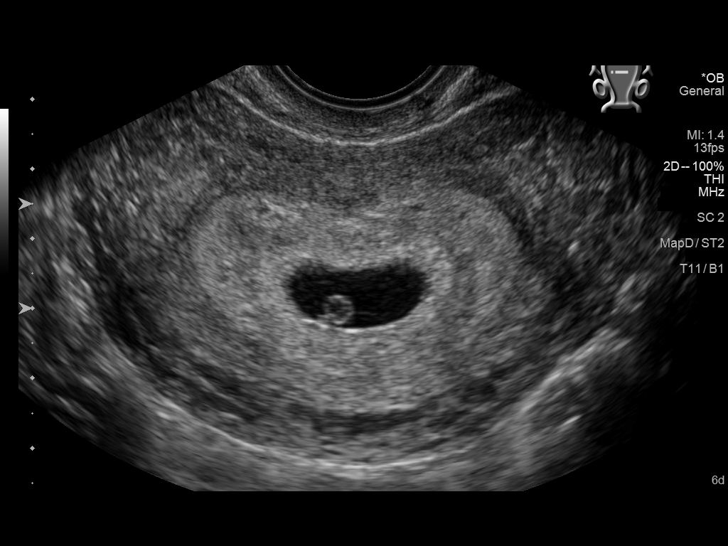
[im 85/105]
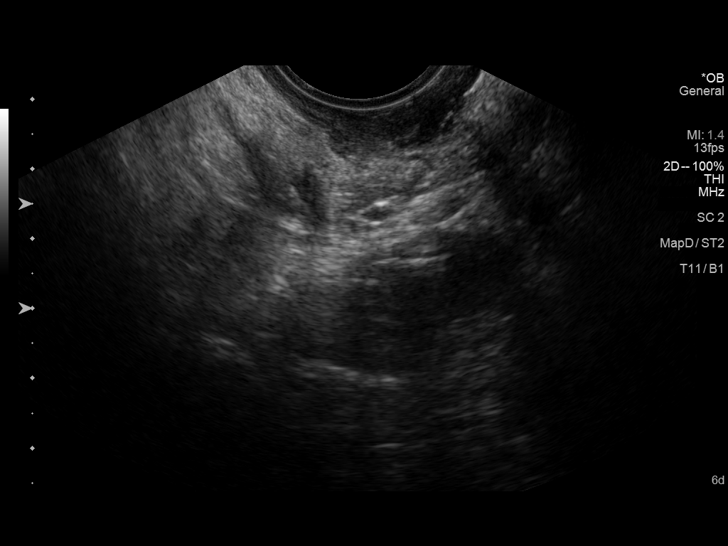
[im 93/105]
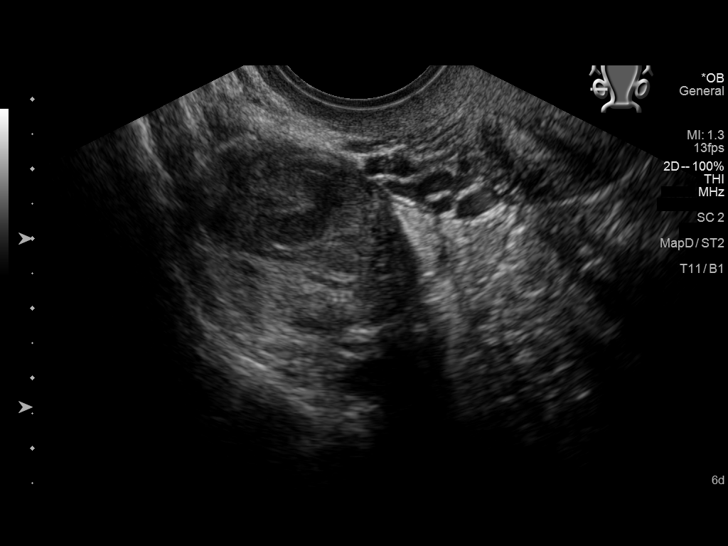
[im 101/105]
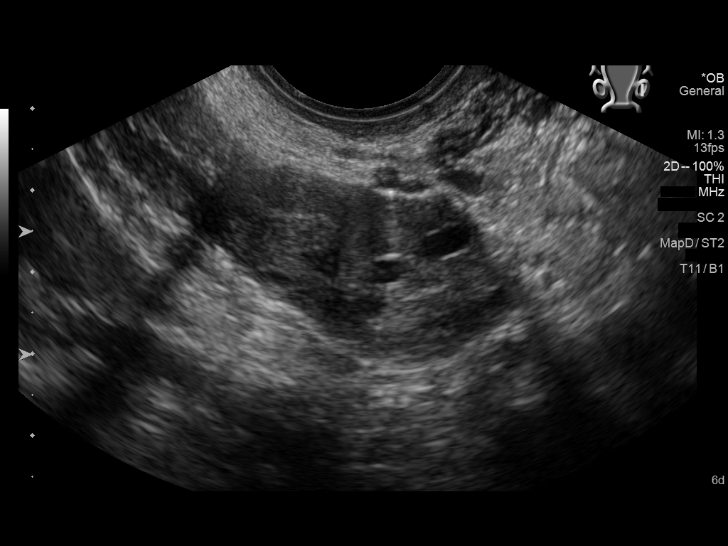

[13 of 28 positions shown; findings below may reference images not displayed]

FINDINGS: Intrauterine gestational sac: Intrauterine gestational sac noted
centrally. Second small cystic area seen within the right fundus,
question 2nd gestational sac. This measures 11 x 7 x 7 mm and
measured 5 x 4 x 2 mm previously.

Yolk sac:  Visualized.

Embryo:  Visualized.

Cardiac Activity: Visualized.

Heart Rate: 105 bpm

MSD:   mm    w     d

CRL:  2.9 mm   5 w   6 d                  US EDC: 01/22/2022

Subchorionic hemorrhage:  None visualized.

Maternal uterus/adnexae: No adnexal mass. Small amount of free fluid
in the pelvis.
IMPRESSION: Intrauterine gestational sac centrally within the uterus, 5 weeks 6
days by crown-rump length. Fetal heart rate 105 beats per minute.

Second small cystic area within the right uterine fundus slightly
larger than prior study but similar appearance. No internal yolk sac
or embryo. This is of unknown etiology, question small 2nd
gestational sac. This could be followed with repeat short-term
ultrasound in 10-14 days to assess for change.

## 2023-11-04 IMAGING — US US MFM OB TRANSVAGINAL
1 series · 13 of 28 positions shown · non-contrast
Comparison: none

[Series 1: us mfm ob transvaginal · 13 of 53 slices shown]
[im 2/53]
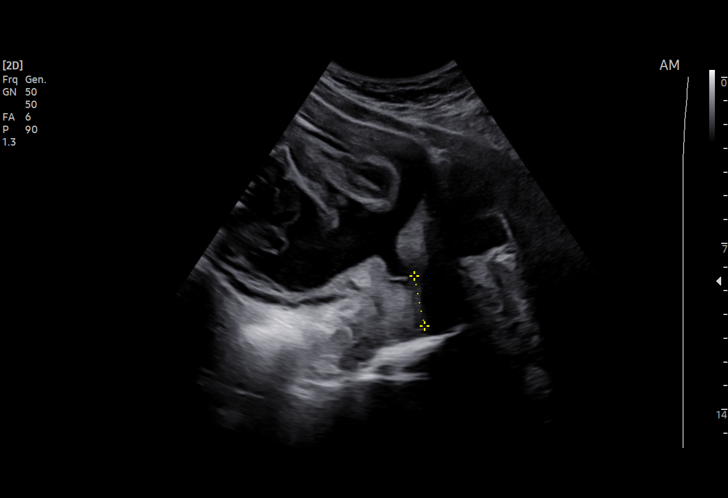
[im 6/53]
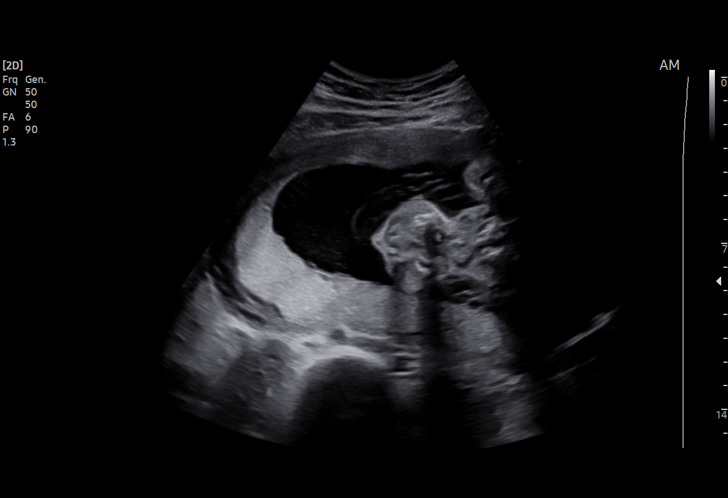
[im 10/53]
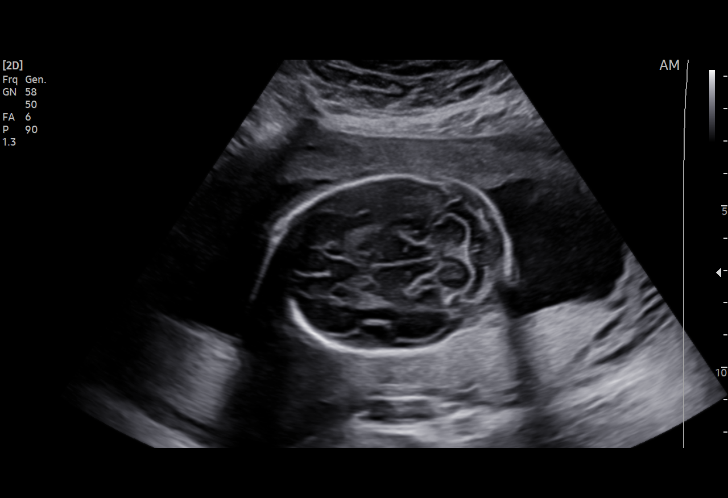
[im 14/53]
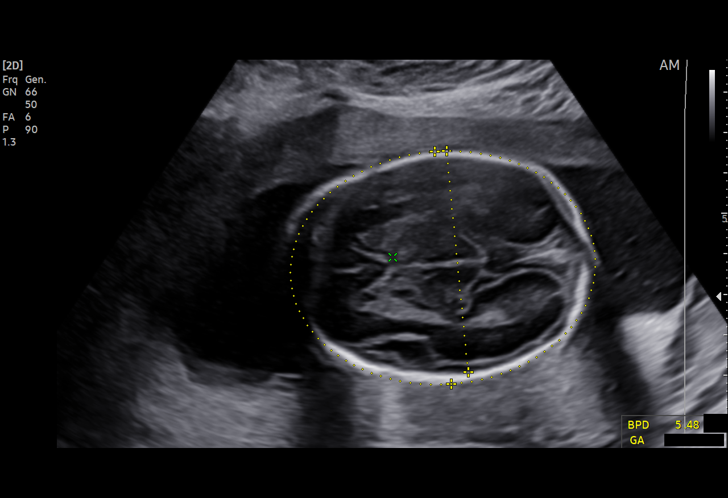
[im 18/53]
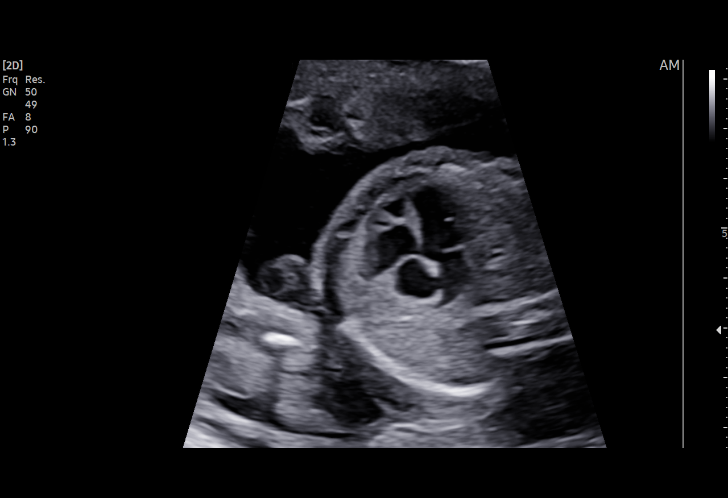
[im 22/53]
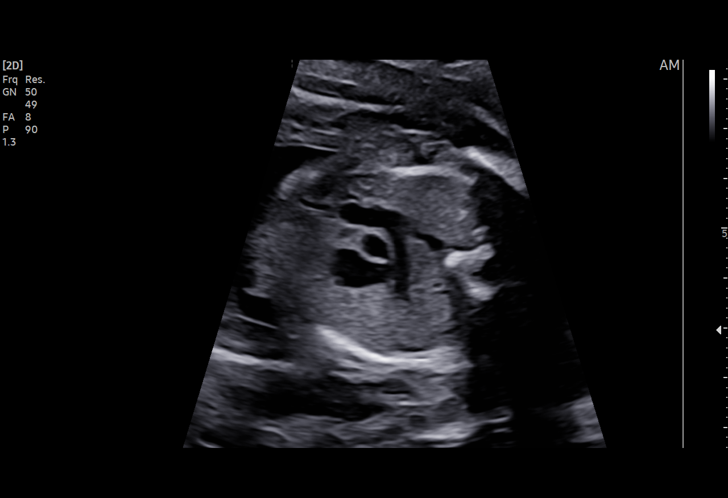
[im 27/53]
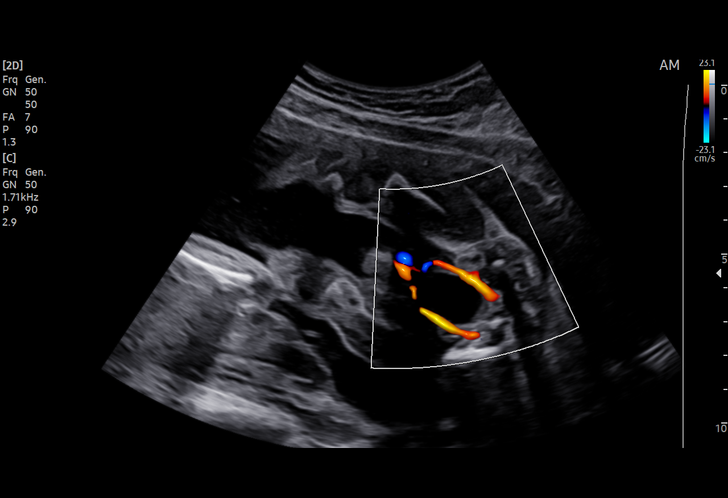
[im 31/53]
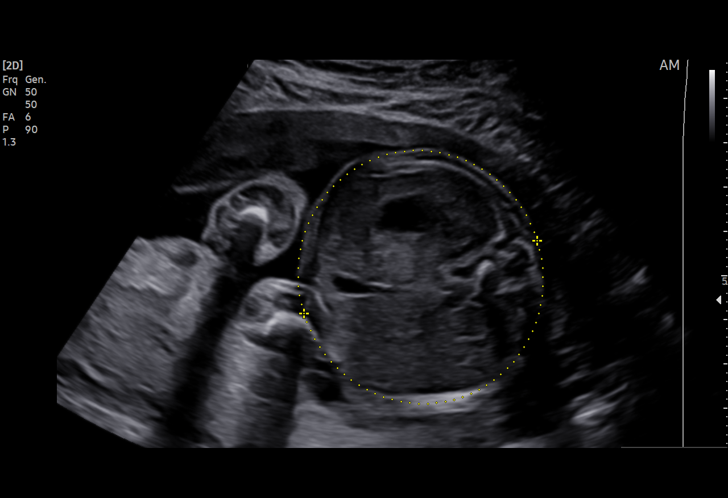
[im 35/53]
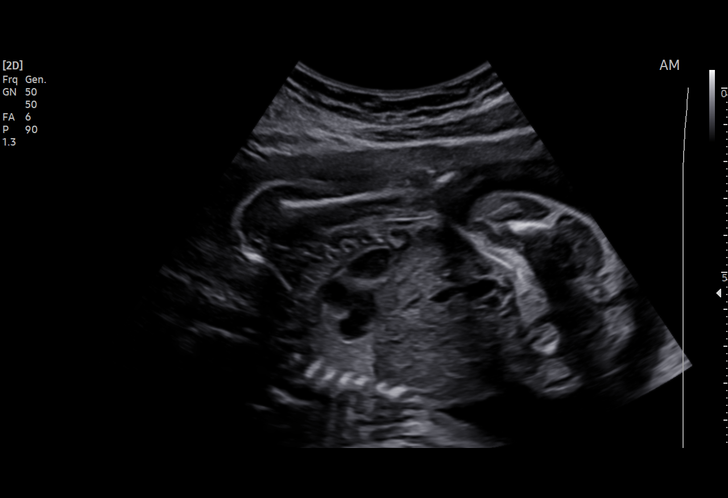
[im 39/53]
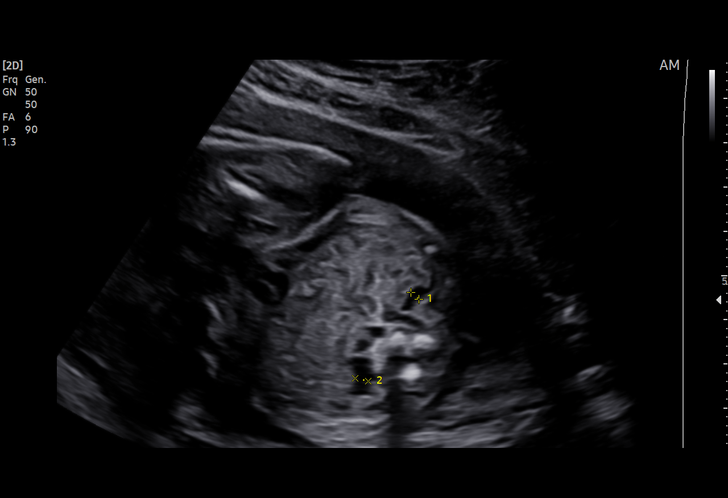
[im 43/53]
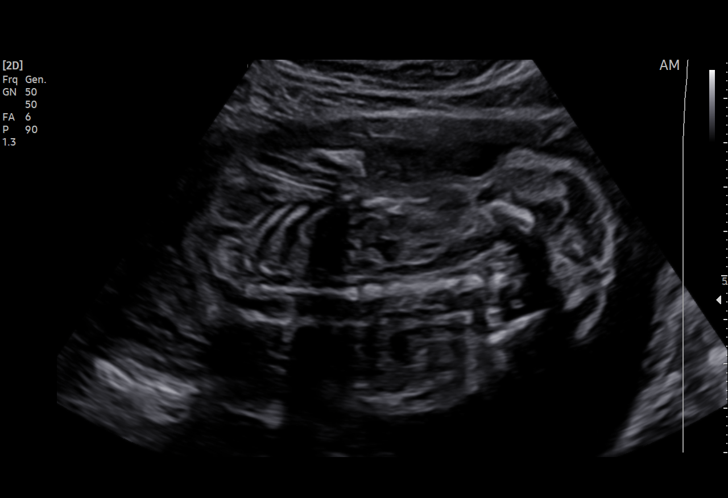
[im 47/53]
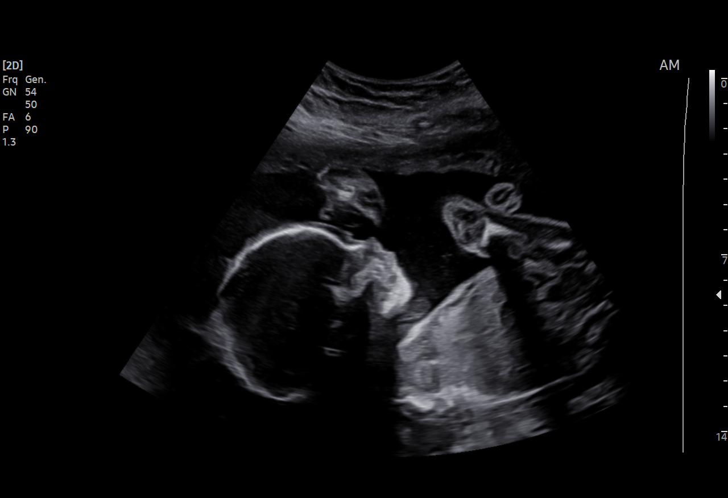
[im 51/53]
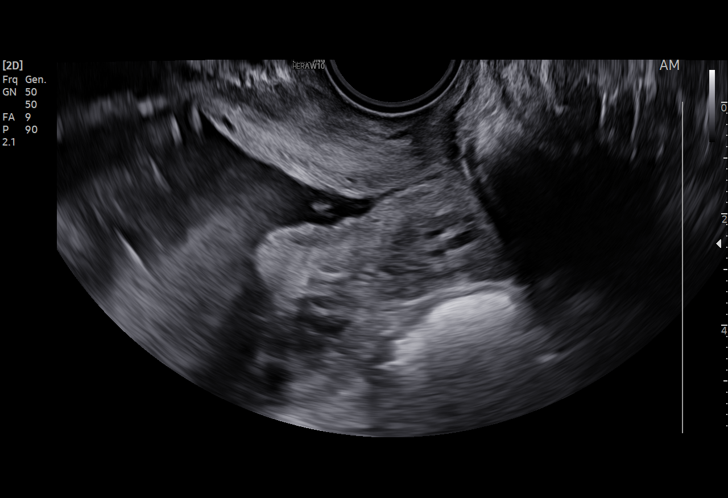

[13 of 28 positions shown; findings below may reference images not displayed]

FRYER CNM

Indications

 Cervical insufficiency, 2nd
 22 weeks gestation of pregnancy
 Encounter for cervical length
 Poor obstetrical history (PPROM) in 7279
 QaterniRVT:  Neg, XY
 Tobacco use complicating pregnancy,
 second trimester
Fetal Evaluation

 Num Of Fetuses:         1
 Fetal Heart Rate(bpm):  158
 Cardiac Activity:       Observed
 Presentation:           Breech
 Placenta:               Posterior
 P. Cord Insertion:      Previously Visualized

 Amniotic Fluid
 AFI FV:      Within normal limits
Biometry

 BPD:      55.5  mm     G. Age:  22w 6d         54  %    CI:         71.8   %    70 - 86
                                                         FL/HC:      17.6   %    19.2 -
 HC:      208.5  mm     G. Age:  23w 0d         45  %    HC/AC:      1.17        1.05 -
 AC:      177.7  mm     G. Age:  22w 5d         40  %    FL/BPD:     65.9   %    71 - 87
 FL:       36.6  mm     G. Age:  21w 4d         11  %    FL/AC:      20.6   %    20 - 24
 CER:      25.9  mm     G. Age:  23w 3d         90  %
 NFT:       4.9  mm

 LV:        5.7  mm
 CM:        5.6  mm

 Est. FW:     493  gm      1 lb 1 oz     24  %
OB History

 Gravidity:    2          SAB:   1
 Living:       0
Gestational Age

 LMP:           26w 1d        Date:  03/24/21                 EDD:   12/29/21
 U/S Today:     22w 4d                                        EDD:   01/23/22
 Best:          22w 5d     Det. By:  Early Ultrasound         EDD:   01/22/22
                                     (05/28/21)
Cervix Uterus Adnexa

 Cervix
 Length:            1.4  cm.
 Measured transvaginally.

 Uterus
 No abnormality visualized.

 Right Ovary
 Not visualized.

 Left Ovary
 Not visualized.
Impression

 Fetal growth is appropriate for the gestational age. Amniotic
 fluid is normal and good fetal activity is seen. We performed
 transvaginal ultrasound to evaluate the cervical length. The
 cervix measures between 1.4 and 1.7 cm. No further
 shortening is seen.
 xxxxxxxxxxxxx[id number]
 Consultation (see [REDACTED] )

 I had the pleasure of seeing Ms. Texnikalarin Kilima Usta today at [facility]
 and is here for ultrasound evaluation.
 Obstetric history significant for a midtrimester pregnancy loss.
 consultation.  In 7279, she had a an 18-week pregnancy loss
 following preterm premature rupture of membranes.  She had
 vaginal bleeding throughout her pregnancy and subchorionic
 hemorrhage was noted on ultrasound.
 In this pregnancy, serial cervical length measurements were
 performed.  At her previous ultrasound performed 2 weeks
 ago, the cervix was 2.4 cm long.
 Patient does not give history of vaginal bleeding or pelvic
 pressure or uterine contractions.
 She reports no chronic medical conditions.
 Cervical Insufficiency
 I explained the diagnosis of cervical insufficiency with help of
 ultrasound images and diagrams.  Cervical insufficiency is
 associated with increased risk of preterm delivery.  I
 discussed the options of rescue cerclage or vaginal
 progesterone treatment.
 Given that she had a history of midtrimester loss, rescue
 cerclage is a superior option.  I explained cerclage procedure
 and possible complications including miscarriage, preterm
 rupture of membranes, vaginal bleeding, infection and
 injuries to bladder or bowel (all rare).
 Alternatively, vaginal progesterone 200 mg daily may be
 taken at bedtime from now till 36 weeks gestation.
 I informed the patient that cerclage is not performed after 24
 weeks gestation.
 Patient opted to have vaginal progesterone treatment and is
 firm in her decision not to undergo rescue cerclage.
 Vaginal progesterone was electronically prescribed and
 confirmed with her pharmacy.
Recommendations

 -Vaginal progesterone 200 mg daily till 36 weeks gestation.
 -An appointment was made for her to return in 2 weeks for
 cervical length measurement.
                 Hart[place], Immanuel

## 2024-01-06 IMAGING — US US MFM OB FOLLOW-UP
1 series · 13 of 28 positions shown · non-contrast
Comparison: none

[Series 1: us mfm ob follow-up · 41 acquisitions, 13 frames shown]
[im 2/41]
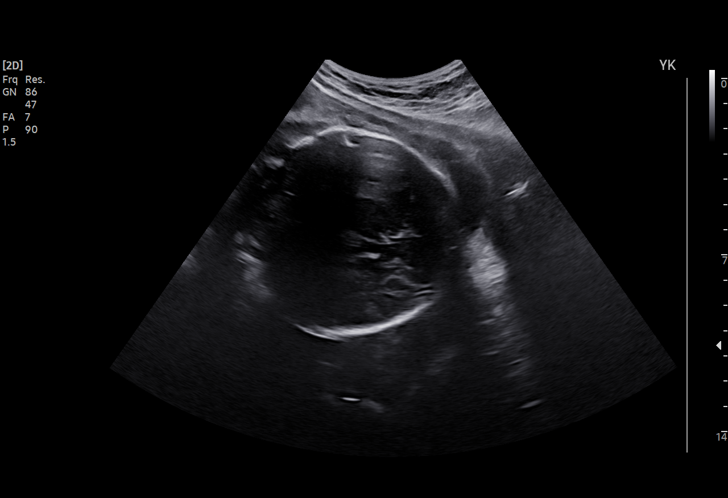
[im 5/41]
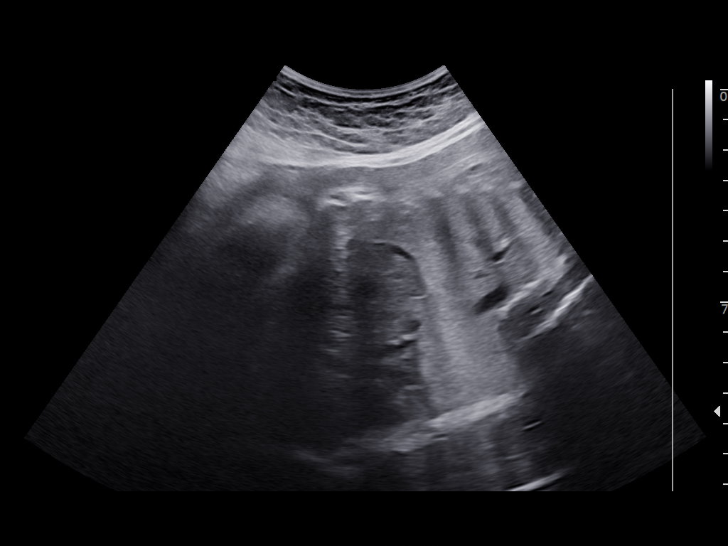
[im 8/41]
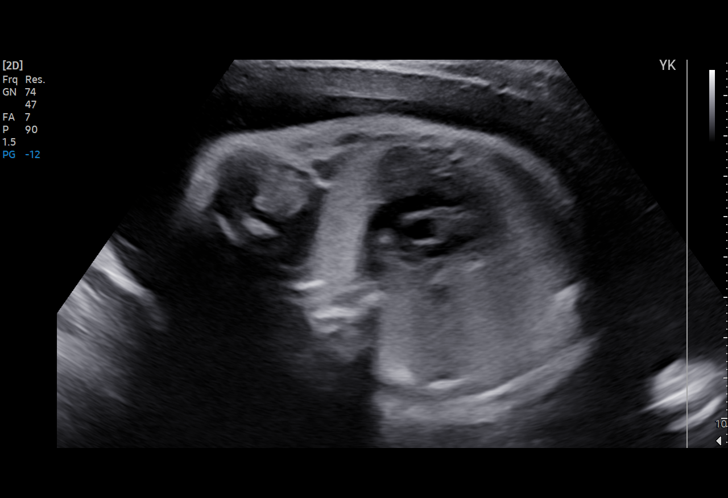
[im 11/41]
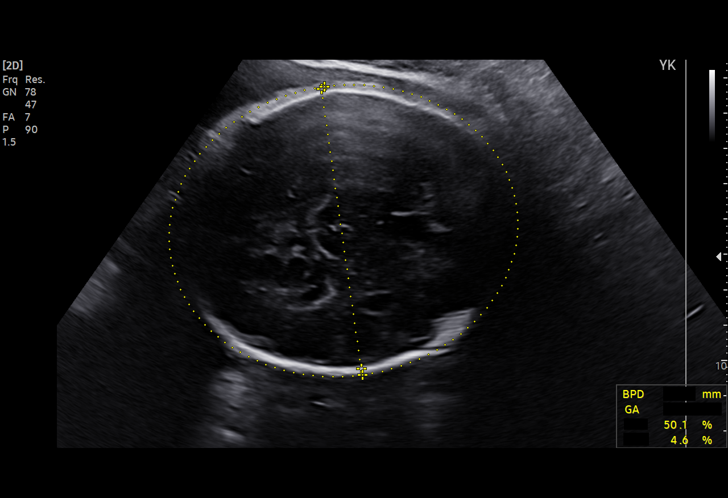
[im 14/41]
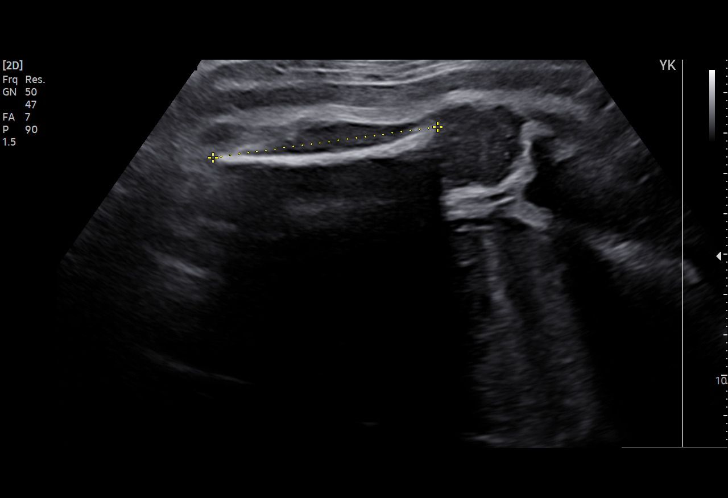
[im 17/41]
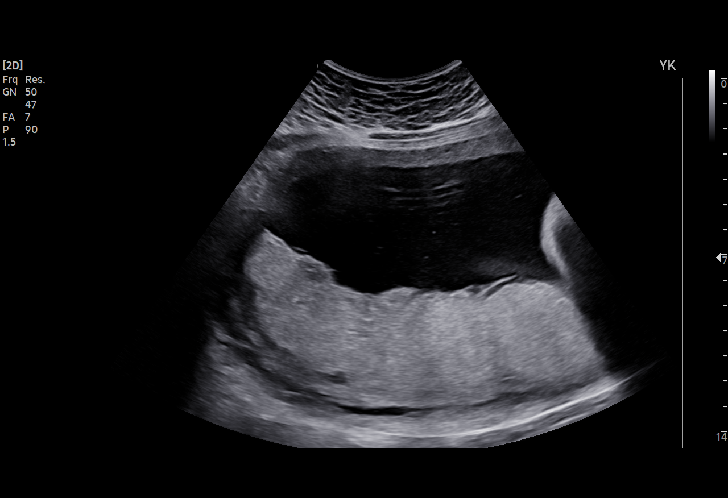
[im 21/41]
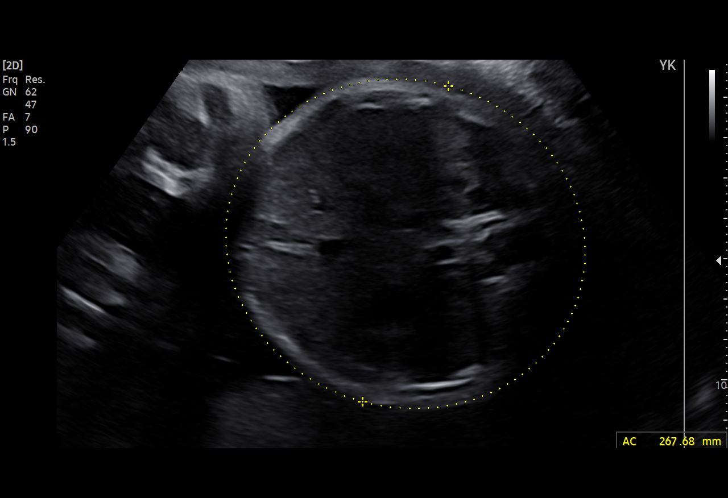
[im 24/41]
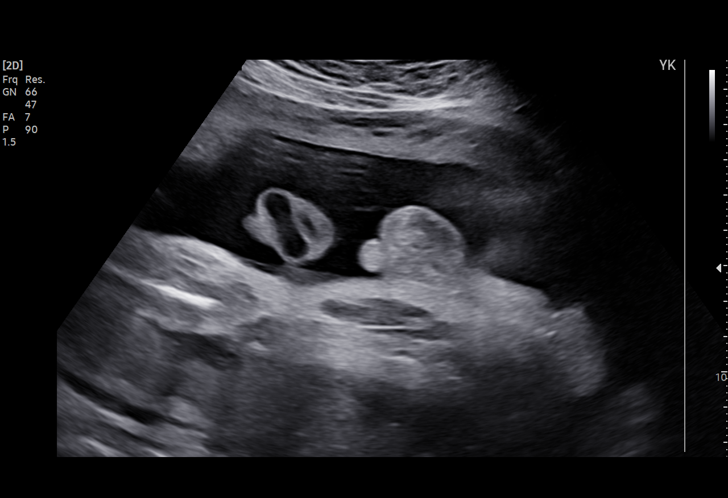
[im 27/41]
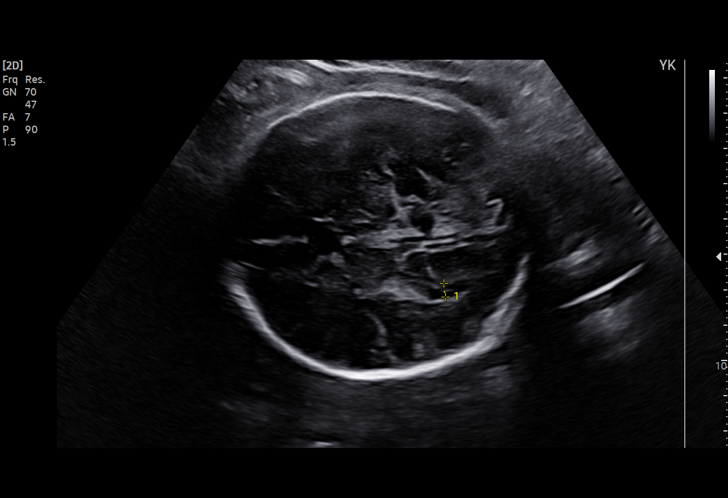
[im 30/41]
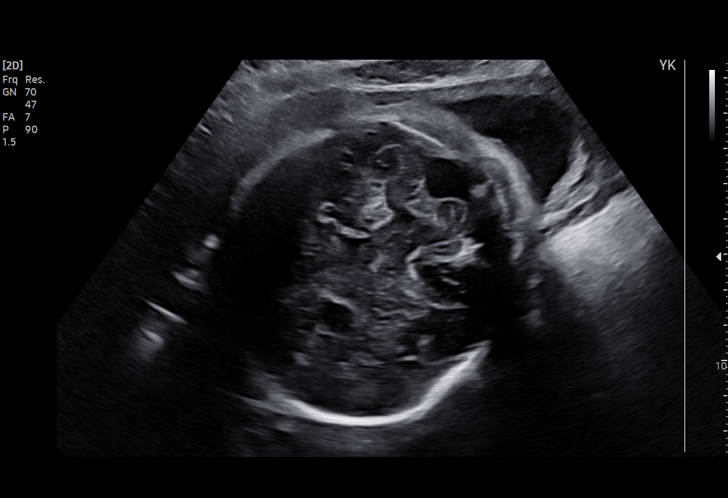
[im 33/41]
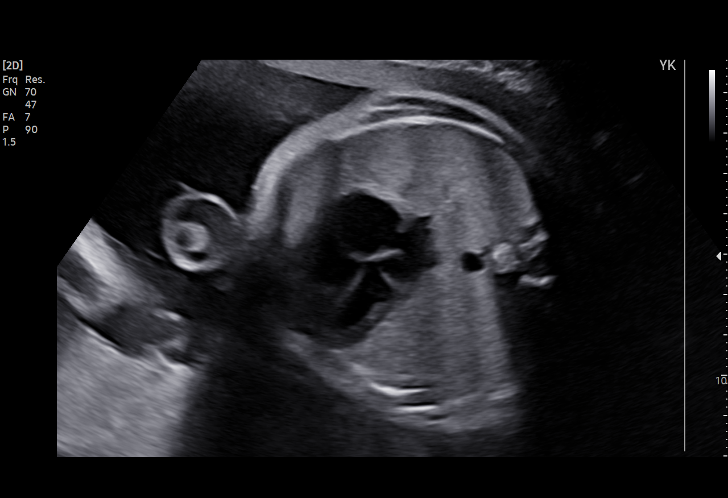
[im 36/41]
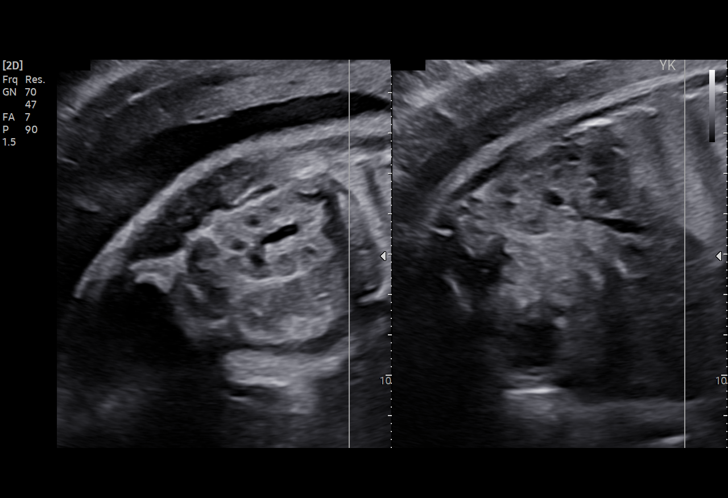
[im 39/41]
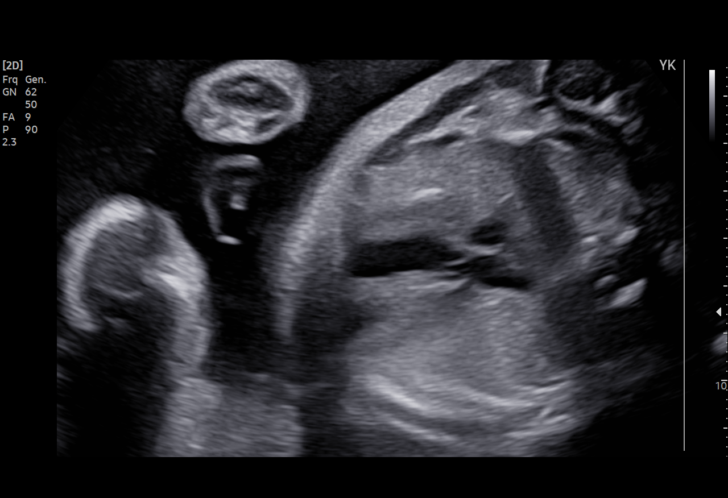

[13 of 28 positions shown; findings below may reference images not displayed]

FRYER CNM

                                                      MAYUMI

Indications

 Poor obstetrical history (PPROM @31wks) in
 5655
 Tobacco use complicating pregnancy, third
 trimester
 Cervical insufficiency, 2nd
 VaterniLPO:  Neg, XY
 31 weeks gestation of pregnancy
Fetal Evaluation

 Num Of Fetuses:         1
 Fetal Heart Rate(bpm):  165
 Cardiac Activity:       Observed
 Presentation:           Cephalic
 Placenta:               Posterior
 P. Cord Insertion:      Visualized, central

 Amniotic Fluid
 AFI FV:      Within normal limits

 AFI Sum(cm)     %Tile       Largest Pocket(cm)
 12.5            35

 RUQ(cm)       RLQ(cm)       LUQ(cm)        LLQ(cm)

Biometry
 BPD:     79.88  mm     G. Age:  32w 1d         51  %    CI:        78.85   %    70 - 86
                                                         FL/HC:      19.6   %    19.1 -
 HC:    284.46   mm     G. Age:  31w 2d          8  %    HC/AC:      1.06        0.96 -
 AC:    268.18   mm     G. Age:  30w 6d         25  %    FL/BPD:     69.8   %    71 - 87
 FL:      55.76  mm     G. Age:  29w 2d        1.7  %    FL/AC:      20.8   %    20 - 24

 Est. FW:    1248  gm      3 lb 8 oz     11  %
OB History

 Gravidity:    2          SAB:   1
 Living:       0
Gestational Age

 LMP:           35w 1d        Date:  03/24/21                 EDD:   12/29/21
 U/S Today:     30w 6d                                        EDD:   01/28/22
 Best:          31w 5d     Det. By:  Early Ultrasound         EDD:   01/22/22
                                     (05/28/21)
Anatomy

 Cranium:               Appears normal         LVOT:                   Appears normal
 Cavum:                 Appears normal         Aortic Arch:            Previously seen
 Ventricles:            Appears normal         Ductal Arch:            Previously seen
 Choroid Plexus:        Previously seen        Diaphragm:              Appears normal
 Cerebellum:            Appears normal         Stomach:                Appears normal, left
                                                                       sided
 Posterior Fossa:       Appears normal         Abdomen:                Appears normal
 Nuchal Fold:           Previously seen        Abdominal Wall:         Previously seen
 Face:                  Orbits and profile     Cord Vessels:           Appears normal (3
                        previously seen                                vessel cord)
 Lips:                  Previously seen        Kidneys:                Appear normal
 Palate:                Previously seen        Bladder:                Appears normal
 Thoracic:              Appears normal         Spine:                  Previously seen
 Heart:                 Appears normal         Upper Extremities:      Previously seen
                        (4CH, axis, and
                        situs)
 RVOT:                  Appears normal         Lower Extremities:      Previously seen

 Other:  Fetus appears to be a male.
Cervix Uterus Adnexa

 Cervix
 Length:            2.1  cm.
 Not visualized (advanced GA >53wks)
Impression
 Patient return for fetal growth assessment.  On today's
 ultrasound, the estimated fetal weight is at the 11th percentile
 and the abdominal circumference measurement is at the 25th
 percentile.  Amniotic fluid is normal and good fetal activity
 seen.
 I spoke with the patient and reassured her.  Patient is
 continuing vaginal progesterone treatment and she
 understands that she is still at higher risk risk for preterm
 delivery.
 This study was remotely read .
Recommendations

 -An appointment was made for her to return in 4 weeks for
 fetal growth assessment.
                Abrahamson, Herold
